# Patient Record
Sex: Female | Born: 1965 | Race: White | Hispanic: No | Marital: Married | Smoking: Current every day smoker
Health system: Southern US, Community
[De-identification: ages and names within clinical notes are randomized; demographics above are authoritative.]

## PROBLEM LIST (undated history)

## (undated) DIAGNOSIS — G43909 Migraine, unspecified, not intractable, without status migrainosus: Secondary | ICD-10-CM

## (undated) DIAGNOSIS — R011 Cardiac murmur, unspecified: Secondary | ICD-10-CM

## (undated) DIAGNOSIS — E0590 Thyrotoxicosis, unspecified without thyrotoxic crisis or storm: Secondary | ICD-10-CM

## (undated) DIAGNOSIS — G709 Myoneural disorder, unspecified: Secondary | ICD-10-CM

## (undated) DIAGNOSIS — R51 Headache: Secondary | ICD-10-CM

## (undated) DIAGNOSIS — J189 Pneumonia, unspecified organism: Secondary | ICD-10-CM

## (undated) DIAGNOSIS — M1990 Unspecified osteoarthritis, unspecified site: Secondary | ICD-10-CM

## (undated) DIAGNOSIS — K219 Gastro-esophageal reflux disease without esophagitis: Secondary | ICD-10-CM

## (undated) DIAGNOSIS — F32A Depression, unspecified: Secondary | ICD-10-CM

## (undated) DIAGNOSIS — I82409 Acute embolism and thrombosis of unspecified deep veins of unspecified lower extremity: Secondary | ICD-10-CM

## (undated) DIAGNOSIS — I10 Essential (primary) hypertension: Secondary | ICD-10-CM

## (undated) DIAGNOSIS — M545 Low back pain: Secondary | ICD-10-CM

## (undated) DIAGNOSIS — R519 Headache, unspecified: Secondary | ICD-10-CM

## (undated) DIAGNOSIS — F329 Major depressive disorder, single episode, unspecified: Secondary | ICD-10-CM

## (undated) DIAGNOSIS — E7800 Pure hypercholesterolemia, unspecified: Secondary | ICD-10-CM

## (undated) DIAGNOSIS — E119 Type 2 diabetes mellitus without complications: Secondary | ICD-10-CM

## (undated) DIAGNOSIS — G8929 Other chronic pain: Secondary | ICD-10-CM

## (undated) HISTORY — PX: ABDOMINAL HYSTERECTOMY: SHX81

## (undated) HISTORY — PX: CHOLECYSTECTOMY: SHX55

## (undated) HISTORY — PX: INCONTINENCE SURGERY: SHX676

## (undated) HISTORY — PX: CARPAL TUNNEL RELEASE: SHX101

## (undated) HISTORY — PX: COLLAGEN INJECTION: SHX5354

---

## 1988-01-06 HISTORY — PX: TUBAL LIGATION: SHX77

## 2011-10-23 ENCOUNTER — Other Ambulatory Visit (HOSPITAL_COMMUNITY): Payer: Self-pay | Admitting: Chiropractic Medicine

## 2011-10-23 DIAGNOSIS — M549 Dorsalgia, unspecified: Secondary | ICD-10-CM

## 2011-10-24 ENCOUNTER — Ambulatory Visit (HOSPITAL_COMMUNITY)
Admission: RE | Admit: 2011-10-24 | Discharge: 2011-10-24 | Disposition: A | Discharge status: Home / Self Care | Payer: Self-pay | Source: Ambulatory Visit | Attending: Chiropractic Medicine | Admitting: Chiropractic Medicine

## 2011-10-24 DIAGNOSIS — M47817 Spondylosis without myelopathy or radiculopathy, lumbosacral region: Secondary | ICD-10-CM | POA: Insufficient documentation

## 2011-10-24 DIAGNOSIS — M545 Low back pain, unspecified: Secondary | ICD-10-CM | POA: Insufficient documentation

## 2011-10-24 DIAGNOSIS — M549 Dorsalgia, unspecified: Secondary | ICD-10-CM

## 2011-10-24 DIAGNOSIS — M79609 Pain in unspecified limb: Secondary | ICD-10-CM | POA: Insufficient documentation

## 2011-12-31 ENCOUNTER — Other Ambulatory Visit: Payer: Self-pay | Admitting: Neurosurgery

## 2011-12-31 DIAGNOSIS — M545 Low back pain: Secondary | ICD-10-CM

## 2012-01-07 ENCOUNTER — Ambulatory Visit
Admission: RE | Admit: 2012-01-07 | Discharge: 2012-01-07 | Disposition: A | Discharge status: Home / Self Care | Payer: BC Managed Care – PPO | Source: Ambulatory Visit | Attending: Neurosurgery | Admitting: Neurosurgery

## 2012-01-07 DIAGNOSIS — M545 Low back pain: Secondary | ICD-10-CM

## 2012-05-17 ENCOUNTER — Other Ambulatory Visit: Payer: Self-pay | Admitting: Neurosurgery

## 2012-05-18 ENCOUNTER — Encounter (HOSPITAL_COMMUNITY): Payer: Self-pay | Admitting: Pharmacy Technician

## 2012-05-21 NOTE — Pre-Procedure Instructions (Signed)
[name]  05/21/2012   Your procedure is scheduled on:  Wednesday, May 21st  Report to [facility] at 0945 AM.  Call this number if you have problems the morning of surgery: [phone]   Remember:   Do not eat food or drink liquids after midnight.   Take these medicines the morning of surgery with A SIP OF WATER: Prilosec, Valium if needed, oxycodone if needed   Do not wear jewelry, make-up or nail polish.  Do not wear lotions, powders, or perfumes. You may wear deodorant.  Do not shave 48 hours prior to surgery. Men may shave face and neck.  Do not bring valuables to the hospital.  Contacts, dentures or bridgework may not be worn into surgery.  Leave suitcase in the car. After surgery it may be brought to your room.  For patients admitted to the hospital, checkout time is 11:00 AM the day of  discharge.   Patients discharged the day of surgery will not be allowed to drive  home.    Special Instructions: Shower using CHG 2 nights before surgery and the night before surgery.  If you shower the day of surgery use CHG.  Use special wash - you have one bottle of CHG for all showers.  You should use approximately 1/3 of the bottle for each shower.   Please read over the following fact sheets that you were given: Pain Booklet, Coughing and Deep Breathing, MRSA Information and Surgical Site Infection Prevention

## 2012-05-23 ENCOUNTER — Encounter (HOSPITAL_COMMUNITY)
Admission: RE | Admit: 2012-05-23 | Discharge: 2012-05-23 | Disposition: A | Discharge status: Home / Self Care | Payer: BC Managed Care – PPO | Source: Ambulatory Visit | Attending: Neurosurgery | Admitting: Neurosurgery

## 2012-05-23 ENCOUNTER — Encounter (HOSPITAL_COMMUNITY): Payer: Self-pay

## 2012-05-23 DIAGNOSIS — Z01810 Encounter for preprocedural cardiovascular examination: Secondary | ICD-10-CM | POA: Insufficient documentation

## 2012-05-23 DIAGNOSIS — Z01812 Encounter for preprocedural laboratory examination: Secondary | ICD-10-CM | POA: Insufficient documentation

## 2012-05-23 DIAGNOSIS — Z01818 Encounter for other preprocedural examination: Secondary | ICD-10-CM | POA: Insufficient documentation

## 2012-05-23 HISTORY — DX: Gastro-esophageal reflux disease without esophagitis: K21.9

## 2012-05-23 HISTORY — DX: Myoneural disorder, unspecified: G70.9

## 2012-05-23 HISTORY — DX: Essential (primary) hypertension: I10

## 2012-05-23 LAB — BASIC METABOLIC PANEL
CO2: 24 mEq/L (ref 19–32)
Chloride: 105 mEq/L (ref 96–112)
GFR calc Af Amer: >90 mL/min (ref >90)
Potassium: 4 mEq/L (ref 3.5–5.1)
Sodium: 141 mEq/L (ref 135–145)

## 2012-05-23 LAB — CBC
HCT: 44.4 % (ref 36.0–46.0)
Hemoglobin: 15.6 g/dL — ABNORMAL HIGH (ref 12.0–15.0)
MCH: 33.6 pg (ref 26.0–34.0)
MCHC: 35.1 g/dL (ref 30.0–36.0)
MCV: 95.7 fL (ref 78.0–100.0)
Platelets: 229 10*3/uL (ref 150–400)
RBC: 4.64 MIL/uL (ref 3.87–5.11)
RDW: 13.1 % (ref 11.5–15.5)
WBC: 11.2 10*3/uL — ABNORMAL HIGH (ref 4.0–10.5)

## 2012-05-23 LAB — SURGICAL PCR SCREEN
MRSA, PCR: NEGATIVE
Staphylococcus aureus: NEGATIVE

## 2012-05-24 MED ORDER — CEFAZOLIN SODIUM-DEXTROSE 2-3 GM-% IV SOLR: 2.0000 g | INTRAVENOUS | Status: DC

## 2012-05-25 ENCOUNTER — Ambulatory Visit (HOSPITAL_COMMUNITY): Payer: BC Managed Care – PPO

## 2012-05-25 ENCOUNTER — Inpatient Hospital Stay (HOSPITAL_COMMUNITY)
Admission: RE | Admit: 2012-05-25 | Discharge: 2012-05-26 | DRG: 758 | Disposition: A | Discharge status: Home / Self Care | Payer: BC Managed Care – PPO | Source: Ambulatory Visit | Attending: Neurosurgery | Admitting: Neurosurgery

## 2012-05-25 ENCOUNTER — Encounter (HOSPITAL_COMMUNITY)
Admission: RE | Disposition: A | Discharge status: Home / Self Care | Payer: Self-pay | Source: Ambulatory Visit | Attending: Neurosurgery

## 2012-05-25 ENCOUNTER — Encounter (HOSPITAL_COMMUNITY): Payer: Self-pay | Admitting: Anesthesiology

## 2012-05-25 ENCOUNTER — Encounter (HOSPITAL_COMMUNITY): Payer: Self-pay | Admitting: *Deleted

## 2012-05-25 ENCOUNTER — Ambulatory Visit (HOSPITAL_COMMUNITY): Payer: BC Managed Care – PPO | Admitting: Anesthesiology

## 2012-05-25 DIAGNOSIS — M5126 Other intervertebral disc displacement, lumbar region: Principal | ICD-10-CM | POA: Diagnosis present

## 2012-05-25 DIAGNOSIS — Z8551 Personal history of malignant neoplasm of bladder: Secondary | ICD-10-CM

## 2012-05-25 DIAGNOSIS — F17200 Nicotine dependence, unspecified, uncomplicated: Secondary | ICD-10-CM | POA: Diagnosis present

## 2012-05-25 DIAGNOSIS — Z9089 Acquired absence of other organs: Secondary | ICD-10-CM

## 2012-05-25 DIAGNOSIS — F411 Generalized anxiety disorder: Secondary | ICD-10-CM | POA: Diagnosis present

## 2012-05-25 DIAGNOSIS — G709 Myoneural disorder, unspecified: Secondary | ICD-10-CM | POA: Diagnosis present

## 2012-05-25 DIAGNOSIS — I10 Essential (primary) hypertension: Secondary | ICD-10-CM | POA: Diagnosis present

## 2012-05-25 DIAGNOSIS — Z794 Long term (current) use of insulin: Secondary | ICD-10-CM

## 2012-05-25 DIAGNOSIS — K219 Gastro-esophageal reflux disease without esophagitis: Secondary | ICD-10-CM | POA: Diagnosis present

## 2012-05-25 DIAGNOSIS — E119 Type 2 diabetes mellitus without complications: Secondary | ICD-10-CM | POA: Diagnosis present

## 2012-05-25 HISTORY — DX: Pure hypercholesterolemia, unspecified: E78.00

## 2012-05-25 HISTORY — PX: LUMBAR DISC SURGERY: SHX700

## 2012-05-25 HISTORY — PX: LUMBAR LAMINECTOMY/DECOMPRESSION MICRODISCECTOMY: SHX5026

## 2012-05-25 HISTORY — DX: Type 2 diabetes mellitus without complications: E11.9

## 2012-05-25 HISTORY — DX: Migraine, unspecified, not intractable, without status migrainosus: G43.909

## 2012-05-25 HISTORY — DX: Other chronic pain: G89.29

## 2012-05-25 HISTORY — DX: Headache, unspecified: R51.9

## 2012-05-25 HISTORY — DX: Headache: R51

## 2012-05-25 HISTORY — DX: Low back pain: M54.5

## 2012-05-25 LAB — GLUCOSE, CAPILLARY
Glucose-Capillary: 106 mg/dL — ABNORMAL HIGH (ref 70–99)
Glucose-Capillary: 83 mg/dL (ref 70–99)
Glucose-Capillary: 132 mg/dL — ABNORMAL HIGH (ref 70–99)

## 2012-05-25 SURGERY — LUMBAR LAMINECTOMY/DECOMPRESSION MICRODISCECTOMY 1 LEVEL
Anesthesia: General | Site: Back | Laterality: Right | Wound class: Clean

## 2012-05-25 MED ORDER — SIMVASTATIN 10 MG PO TABS
10.0000 mg | ORAL_TABLET | Freq: Every day | ORAL | Status: DC
  Administered 2012-05-25: 10 mg via ORAL
  Filled 2012-05-25 (×2): qty 1

## 2012-05-25 MED ORDER — DIAZEPAM 5 MG PO TABS
5.0000 mg | ORAL_TABLET | Freq: Four times a day (QID) | ORAL | Status: DC | PRN
  Administered 2012-05-26: 5 mg via ORAL
  Filled 2012-05-25: qty 1

## 2012-05-25 MED ORDER — PROPOFOL 10 MG/ML IV BOLUS
INTRAVENOUS | Status: DC | PRN
  Administered 2012-05-25: 200 mg via INTRAVENOUS

## 2012-05-25 MED ORDER — CEFAZOLIN SODIUM 1-5 GM-% IV SOLN
1.0000 g | Freq: Three times a day (TID) | INTRAVENOUS | Status: AC
  Administered 2012-05-25 – 2012-05-26 (×2): 1 g via INTRAVENOUS
  Filled 2012-05-25 (×2): qty 50

## 2012-05-25 MED ORDER — OXYCODONE-ACETAMINOPHEN 5-325 MG PO TABS
ORAL_TABLET | ORAL | Status: AC
  Filled 2012-05-25: qty 2

## 2012-05-25 MED ORDER — LIDOCAINE HCL (CARDIAC) 20 MG/ML IV SOLN
INTRAVENOUS | Status: DC | PRN
  Administered 2012-05-25: 100 mg via INTRAVENOUS

## 2012-05-25 MED ORDER — OXYCODONE-ACETAMINOPHEN 5-325 MG PO TABS
1.0000 | ORAL_TABLET | ORAL | Status: DC | PRN
  Administered 2012-05-25 – 2012-05-26 (×3): 2 via ORAL
  Filled 2012-05-25 (×2): qty 2

## 2012-05-25 MED ORDER — OXYCODONE HCL 5 MG PO TABS
ORAL_TABLET | ORAL | Status: AC
  Filled 2012-05-25: qty 1

## 2012-05-25 MED ORDER — 0.9 % SODIUM CHLORIDE (POUR BTL) OPTIME
TOPICAL | Status: DC | PRN
  Administered 2012-05-25: 1000 mL

## 2012-05-25 MED ORDER — ZOLPIDEM TARTRATE 5 MG PO TABS: 5.0000 mg | ORAL_TABLET | Freq: Every evening | ORAL | Status: DC | PRN

## 2012-05-25 MED ORDER — LIDOCAINE HCL 4 % MT SOLN
OROMUCOSAL | Status: DC | PRN
  Administered 2012-05-25: 4 mL via TOPICAL

## 2012-05-25 MED ORDER — HYDROMORPHONE HCL PF 1 MG/ML IJ SOLN
0.2500 mg | INTRAMUSCULAR | Status: DC | PRN
  Administered 2012-05-25 (×2): 0.5 mg via INTRAVENOUS

## 2012-05-25 MED ORDER — BUPIVACAINE LIPOSOME 1.3 % IJ SUSP
INTRAMUSCULAR | Status: DC | PRN
  Administered 2012-05-25: 20 mL

## 2012-05-25 MED ORDER — LISINOPRIL 10 MG PO TABS
10.0000 mg | ORAL_TABLET | Freq: Every morning | ORAL | Status: DC
  Filled 2012-05-25: qty 1

## 2012-05-25 MED ORDER — ONDANSETRON HCL 4 MG/2ML IJ SOLN
INTRAMUSCULAR | Status: DC | PRN
  Administered 2012-05-25: 4 mg via INTRAVENOUS

## 2012-05-25 MED ORDER — ACETAMINOPHEN 650 MG RE SUPP: 650.0000 mg | RECTAL | Status: DC | PRN

## 2012-05-25 MED ORDER — HEMOSTATIC AGENTS (NO CHARGE) OPTIME
TOPICAL | Status: DC | PRN
  Administered 2012-05-25: 1 via TOPICAL

## 2012-05-25 MED ORDER — SODIUM CHLORIDE 0.9 % IV SOLN
250.0000 mL | INTRAVENOUS | Status: DC
  Filled 2012-05-25: qty 250

## 2012-05-25 MED ORDER — GLYCOPYRROLATE 0.2 MG/ML IJ SOLN
INTRAMUSCULAR | Status: DC | PRN
  Administered 2012-05-25: .6 mg via INTRAVENOUS

## 2012-05-25 MED ORDER — ONDANSETRON HCL 4 MG/2ML IJ SOLN: 4.0000 mg | Freq: Four times a day (QID) | INTRAMUSCULAR | Status: DC | PRN

## 2012-05-25 MED ORDER — NEOSTIGMINE METHYLSULFATE 1 MG/ML IJ SOLN
INTRAMUSCULAR | Status: DC | PRN
  Administered 2012-05-25: 5 mg via INTRAVENOUS

## 2012-05-25 MED ORDER — SODIUM CHLORIDE 0.9 % IJ SOLN: 3.0000 mL | INTRAMUSCULAR | Status: DC | PRN

## 2012-05-25 MED ORDER — PHENOL 1.4 % MT LIQD: 1.0000 | OROMUCOSAL | Status: DC | PRN

## 2012-05-25 MED ORDER — HYDROMORPHONE HCL PF 1 MG/ML IJ SOLN
INTRAMUSCULAR | Status: AC
  Filled 2012-05-25: qty 1

## 2012-05-25 MED ORDER — FENTANYL CITRATE 0.05 MG/ML IJ SOLN
INTRAMUSCULAR | Status: AC
  Filled 2012-05-25: qty 2

## 2012-05-25 MED ORDER — METFORMIN HCL 500 MG PO TABS
1000.0000 mg | ORAL_TABLET | Freq: Every evening | ORAL | Status: DC
  Filled 2012-05-25: qty 2

## 2012-05-25 MED ORDER — MAGNESIUM HYDROXIDE 400 MG/5ML PO SUSP: 30.0000 mL | Freq: Every day | ORAL | Status: DC | PRN

## 2012-05-25 MED ORDER — OXYCODONE HCL 5 MG/5ML PO SOLN: 5.0000 mg | Freq: Once | ORAL | Status: AC | PRN

## 2012-05-25 MED ORDER — CEFAZOLIN SODIUM-DEXTROSE 2-3 GM-% IV SOLR
INTRAVENOUS | Status: AC
  Administered 2012-05-25: 2 g via INTRAVENOUS
  Filled 2012-05-25: qty 50

## 2012-05-25 MED ORDER — THROMBIN 5000 UNITS EX SOLR
CUTANEOUS | Status: DC | PRN
  Administered 2012-05-25 (×2): 5000 [IU] via TOPICAL

## 2012-05-25 MED ORDER — MORPHINE SULFATE 2 MG/ML IJ SOLN
1.0000 mg | INTRAMUSCULAR | Status: DC | PRN
  Administered 2012-05-25 – 2012-05-26 (×3): 2 mg via INTRAVENOUS
  Filled 2012-05-25 (×3): qty 1

## 2012-05-25 MED ORDER — MENTHOL 3 MG MT LOZG: 1.0000 | LOZENGE | OROMUCOSAL | Status: DC | PRN

## 2012-05-25 MED ORDER — ONDANSETRON HCL 4 MG/2ML IJ SOLN: 4.0000 mg | INTRAMUSCULAR | Status: DC | PRN

## 2012-05-25 MED ORDER — BUPIVACAINE LIPOSOME 1.3 % IJ SUSP
20.0000 mL | INTRAMUSCULAR | Status: DC
  Filled 2012-05-25: qty 20

## 2012-05-25 MED ORDER — FENTANYL CITRATE 0.05 MG/ML IJ SOLN
INTRAMUSCULAR | Status: DC | PRN
  Administered 2012-05-25: 100 ug via INTRAVENOUS

## 2012-05-25 MED ORDER — OXYCODONE HCL 5 MG PO TABS
5.0000 mg | ORAL_TABLET | Freq: Once | ORAL | Status: AC | PRN
  Administered 2012-05-25: 5 mg via ORAL

## 2012-05-25 MED ORDER — MIDAZOLAM HCL 5 MG/5ML IJ SOLN
INTRAMUSCULAR | Status: DC | PRN
  Administered 2012-05-25: 2 mg via INTRAVENOUS

## 2012-05-25 MED ORDER — METHYLPREDNISOLONE ACETATE 80 MG/ML IJ SUSP
INTRAMUSCULAR | Status: DC | PRN
  Administered 2012-05-25: 80 mg via INTRA_ARTICULAR

## 2012-05-25 MED ORDER — LACTATED RINGERS IV SOLN
INTRAVENOUS | Status: DC | PRN
  Administered 2012-05-25 (×2): via INTRAVENOUS

## 2012-05-25 MED ORDER — SODIUM CHLORIDE 0.9 % IJ SOLN
3.0000 mL | Freq: Two times a day (BID) | INTRAMUSCULAR | Status: DC
  Administered 2012-05-26: 3 mL via INTRAVENOUS

## 2012-05-25 MED ORDER — SODIUM CHLORIDE 0.9 % IV SOLN
INTRAVENOUS | Status: DC
  Administered 2012-05-25: 21:00:00 via INTRAVENOUS

## 2012-05-25 MED ORDER — SUFENTANIL CITRATE 50 MCG/ML IV SOLN
INTRAVENOUS | Status: DC | PRN
  Administered 2012-05-25: 5 ug via INTRAVENOUS
  Administered 2012-05-25: 10 ug via INTRAVENOUS
  Administered 2012-05-25: 20 ug via INTRAVENOUS
  Administered 2012-05-25: 5 ug via INTRAVENOUS
  Administered 2012-05-25: 10 ug via INTRAVENOUS

## 2012-05-25 MED ORDER — ACETAMINOPHEN 325 MG PO TABS: 650.0000 mg | ORAL_TABLET | ORAL | Status: DC | PRN

## 2012-05-25 MED ORDER — ROCURONIUM BROMIDE 100 MG/10ML IV SOLN
INTRAVENOUS | Status: DC | PRN
  Administered 2012-05-25: 10 mg via INTRAVENOUS
  Administered 2012-05-25: 50 mg via INTRAVENOUS
  Administered 2012-05-25: 10 mg via INTRAVENOUS

## 2012-05-25 SURGICAL SUPPLY — 62 items
APL SKNCLS STERI-STRIP NONHPOA (GAUZE/BANDAGES/DRESSINGS) ×1
BENZOIN TINCTURE PRP APPL 2/3 (GAUZE/BANDAGES/DRESSINGS) ×2 IMPLANT
BLADE SURG ROTATE 9660 (MISCELLANEOUS) IMPLANT
BUR ACORN 6.0 (BURR) ×2 IMPLANT
BUR MATCHSTICK NEURO 3.0 LAGG (BURR) ×1 IMPLANT
CANISTER SUCTION 2500CC (MISCELLANEOUS) ×2 IMPLANT
CLOTH BEACON ORANGE TIMEOUT ST (SAFETY) ×2 IMPLANT
CONT SPEC 4OZ CLIKSEAL STRL BL (MISCELLANEOUS) ×2 IMPLANT
DRAPE C-ARM 42X72 X-RAY (DRAPES) ×2 IMPLANT
DRAPE LAPAROTOMY 100X72X124 (DRAPES) ×2 IMPLANT
DRAPE MICROSCOPE LEICA (MISCELLANEOUS) ×2 IMPLANT
DRAPE POUCH INSTRU U-SHP 10X18 (DRAPES) ×2 IMPLANT
DRSG PAD ABDOMINAL 8X10 ST (GAUZE/BANDAGES/DRESSINGS) ×2 IMPLANT
DURAPREP 26ML APPLICATOR (WOUND CARE) ×2 IMPLANT
ELECT BLADE 4.0 EZ CLEAN MEGAD (MISCELLANEOUS) ×2
ELECT REM PT RETURN 9FT ADLT (ELECTROSURGICAL) ×2
ELECTRODE BLDE 4.0 EZ CLN MEGD (MISCELLANEOUS) IMPLANT
ELECTRODE REM PT RTRN 9FT ADLT (ELECTROSURGICAL) ×1 IMPLANT
GAUZE SPONGE 4X4 16PLY XRAY LF (GAUZE/BANDAGES/DRESSINGS) IMPLANT
GLOVE BIO SURGEON STRL SZ8.5 (GLOVE) ×1 IMPLANT
GLOVE BIOGEL M 8.0 STRL (GLOVE) ×3 IMPLANT
GLOVE BIOGEL PI IND STRL 8 (GLOVE) IMPLANT
GLOVE BIOGEL PI INDICATOR 8 (GLOVE) ×2
GLOVE ECLIPSE 7.5 STRL STRAW (GLOVE) ×2 IMPLANT
GLOVE EXAM NITRILE LRG STRL (GLOVE) IMPLANT
GLOVE EXAM NITRILE MD LF STRL (GLOVE) IMPLANT
GLOVE EXAM NITRILE XL STR (GLOVE) IMPLANT
GLOVE EXAM NITRILE XS STR PU (GLOVE) IMPLANT
GOWN BRE IMP SLV AUR LG STRL (GOWN DISPOSABLE) ×2 IMPLANT
GOWN BRE IMP SLV AUR XL STRL (GOWN DISPOSABLE) IMPLANT
GOWN STRL REIN 2XL LVL4 (GOWN DISPOSABLE) IMPLANT
KIT BASIN OR (CUSTOM PROCEDURE TRAY) ×2 IMPLANT
KIT ROOM TURNOVER OR (KITS) ×2 IMPLANT
NDL HYPO 18GX1.5 BLUNT FILL (NEEDLE) IMPLANT
NDL HYPO 21X1.5 SAFETY (NEEDLE) IMPLANT
NDL HYPO 25X1 1.5 SAFETY (NEEDLE) IMPLANT
NDL SPNL 20GX3.5 QUINCKE YW (NEEDLE) IMPLANT
NEEDLE HYPO 18GX1.5 BLUNT FILL (NEEDLE) ×4 IMPLANT
NEEDLE HYPO 21X1.5 SAFETY (NEEDLE) ×2 IMPLANT
NEEDLE HYPO 25X1 1.5 SAFETY (NEEDLE) ×2 IMPLANT
NEEDLE SPNL 20GX3.5 QUINCKE YW (NEEDLE) IMPLANT
NS IRRIG 1000ML POUR BTL (IV SOLUTION) ×2 IMPLANT
PACK LAMINECTOMY NEURO (CUSTOM PROCEDURE TRAY) ×2 IMPLANT
PAD ARMBOARD 7.5X6 YLW CONV (MISCELLANEOUS) ×6 IMPLANT
PATTIES SURGICAL .5 X1 (DISPOSABLE) ×1 IMPLANT
RUBBERBAND STERILE (MISCELLANEOUS) ×4 IMPLANT
SENSORCAINE WITH EPI 1:200,000 (MPF) IMPLANT
SPONGE GAUZE 4X4 12PLY (GAUZE/BANDAGES/DRESSINGS) ×2 IMPLANT
SPONGE LAP 4X18 X RAY DECT (DISPOSABLE) IMPLANT
SPONGE SURGIFOAM ABS GEL SZ50 (HEMOSTASIS) ×2 IMPLANT
STRIP CLOSURE SKIN 1/2X4 (GAUZE/BANDAGES/DRESSINGS) ×2 IMPLANT
SUT VIC AB 0 CT1 18XCR BRD8 (SUTURE) ×1 IMPLANT
SUT VIC AB 0 CT1 8-18 (SUTURE) ×2
SUT VIC AB 2-0 CP2 18 (SUTURE) ×2 IMPLANT
SUT VIC AB 3-0 SH 8-18 (SUTURE) ×2 IMPLANT
SYR 20CC LL (SYRINGE) ×1 IMPLANT
SYR 20ML ECCENTRIC (SYRINGE) ×2 IMPLANT
SYR 5ML LL (SYRINGE) ×1 IMPLANT
TAPE CLOTH SURG 4X10 WHT LF (GAUZE/BANDAGES/DRESSINGS) ×1 IMPLANT
TOWEL OR 17X24 6PK STRL BLUE (TOWEL DISPOSABLE) ×2 IMPLANT
TOWEL OR 17X26 10 PK STRL BLUE (TOWEL DISPOSABLE) ×2 IMPLANT
WATER STERILE IRR 1000ML POUR (IV SOLUTION) ×2 IMPLANT

## 2012-05-25 NOTE — Transfer of Care (Signed)
Immediate Anesthesia Transfer of Care Note  Patient: Ashley Reilly  Procedure(s) Performed: Procedure(s) with comments: Right Lumbar four-five Microdiskectomy  (Right) - Right Lumbar four-five Microdiskectomy  Patient Location: PACU  Anesthesia Type:General  Level of Consciousness: awake, sedated and patient cooperative  Airway & Oxygen Therapy: Patient Spontanous Breathing and Patient connected to nasal cannula oxygen  Post-op Assessment: Report given to PACU RN, Post -op Vital signs reviewed and stable, Patient moving all extremities and Patient moving all extremities X 4  Post vital signs: Reviewed and stable  Complications: No apparent anesthesia complications

## 2012-05-25 NOTE — Progress Notes (Signed)
Patient went off unit with IV pole to smoke outside with female companion. Patient states " I waited 45 minutes for someone to call the doctor to get me permission to go off the unit so I can smoke. I want to walk around. I cannot sit in one spot all day." Patient underwent a right lumbar 4-5 microdiskectomy today. Dr.Cabbell made aware of incident and stated that patient is not permitted to go off unit to smoke.

## 2012-05-25 NOTE — Progress Notes (Signed)
While giving pain medication, patient states" Now I'm going to be honest with you. I had to go and pee and I got out of the chair and bent down to unplug the IV and I fell over and landed on my right side." Patient states" No one saw me fall. It was right after the admitting nurse and the nurse tech left the room.

## 2012-05-25 NOTE — Progress Notes (Signed)
Op note [phone]

## 2012-05-25 NOTE — Progress Notes (Signed)
Received patient from PACU; vitals stable.  Patient is alert and oriented x4 and denies pain.  Dressing clean/dry/intact to lower back.  Patient oriented to room and unit.  Will continue to monitor.

## 2012-05-25 NOTE — Anesthesia Preprocedure Evaluation (Signed)
Anesthesia Evaluation  Patient identified by MRN, date of birth, ID band Patient awake    Reviewed: Allergy & Precautions, H&P , NPO status , Patient's Chart, lab work & pertinent test results  Airway Mallampati: II  Neck ROM: full    Dental   Pulmonary Current Smoker,          Cardiovascular hypertension,     Neuro/Psych  Headaches,  Neuromuscular disease    GI/Hepatic GERD-  ,  Endo/Other  diabetes, Type 2obese  Renal/GU      Musculoskeletal   Abdominal   Peds  Hematology   Anesthesia Other Findings   Reproductive/Obstetrics                           Anesthesia Physical Anesthesia Plan  ASA: III  Anesthesia Plan: General   Post-op Pain Management:    Induction: Intravenous  Airway Management Planned: Oral ETT  Additional Equipment:   Intra-op Plan:   Post-operative Plan: Extubation in OR  Informed Consent: I have reviewed the patients History and Physical, chart, labs and discussed the procedure including the risks, benefits and alternatives for the proposed anesthesia with the patient or authorized representative who has indicated his/her understanding and acceptance.     Plan Discussed with: CRNA, Anesthesiologist and Surgeon  Anesthesia Plan Comments:         Anesthesia Quick Evaluation

## 2012-05-25 NOTE — Progress Notes (Signed)
Patient made aware that she is not allowed to go off the unit to smoke as per Dr. Franky Macho but can walk around the unit as much as she likes. Patient states "Yeah.well"

## 2012-05-25 NOTE — H&P (Signed)
Ashley Reilly is an 47 y.o. female.   Chief Complaint: lbp HPI: while at work back in 10/13 she developed lbp with radiation to the right leg. Since then she has been seen by the chiropractic, pain clinic, had epidurals with no relief of the pain.  Past Medical History  Diagnosis Date  . Hypertension   . Headache     migraines  . Neuromuscular disorder     rt leg tingling/numbness  . GERD (gastroesophageal reflux disease)   . Diabetes mellitus without complication     Past Surgical History  Procedure Laterality Date  . Tubal ligation    . Bladder surgery      tumors removed+ sling placed  . Cholecystectomy    . Carpal tunnel release      left    History reviewed. No pertinent family history. Social History:  reports that she has been smoking.  She does not have any smokeless tobacco history on file. She reports that she does not drink alcohol or use illicit drugs.  Allergies: No Known Allergies  Medications Prior to Admission  Medication Sig Dispense Refill  . diazepam (VALIUM) 5 MG tablet Take 5 mg by mouth every 6 (six) hours as needed for anxiety. For spasms      . lisinopril (PRINIVIL,ZESTRIL) 10 MG tablet Take 10 mg by mouth every morning.      . metFORMIN (GLUCOPHAGE) 500 MG tablet Take 1,000 mg by mouth every evening. Take with food      . omeprazole (PRILOSEC) 20 MG capsule Take 20 mg by mouth 2 (two) times daily.      Marland Kitchen oxyCODONE (ROXICODONE) 15 MG immediate release tablet Take 15 mg by mouth every 4 (four) hours as needed for pain. For pain      . pravastatin (PRAVACHOL) 10 MG tablet Take 10 mg by mouth at bedtime.        Results for orders placed during the hospital encounter of 05/25/12 (from the past 48 hour(s))  GLUCOSE, CAPILLARY     Status: Abnormal   Collection Time    05/25/12 10:26 AM      Result Value Range   Glucose-Capillary 106 (*) 70 - 99 mg/dL   Dg Chest 2 View  1/61/0960   *RADIOLOGY REPORT*  Clinical Data: Hypertension  CHEST - 2 VIEW   Comparison: None.  Findings: Cardiomediastinal silhouette appears normal.  No acute pulmonary disease is noted.  Bony thorax is intact.  IMPRESSION: No acute cardiopulmonary abnormality seen.   Original Report Authenticated By: Lupita Raider.,  M.D.    Review of Systems  Constitutional: Negative.   HENT: Negative.   Eyes: Negative.   Respiratory: Negative.   Cardiovascular: Negative.        Arterial hypertension  Gastrointestinal: Negative.   Genitourinary: Negative.   Musculoskeletal: Positive for back pain.  Skin: Negative.   Neurological: Positive for sensory change and focal weakness.  Endo/Heme/Allergies:       DM  Psychiatric/Behavioral: Negative.     Blood pressure 107/68, pulse 99, temperature 97.6 F (36.4 C), temperature source Oral, resp. rate 20, last menstrual period 05/19/2012, SpO2 96.00%. Physical Exam  Patient limping from the right leg. Hent,nl.neck, nl. Cv, nl. Lungs, clear. Abdomen, soft. Extremities, nl. NEURO WEAKNESS OF df OF RIGHT FOOT, 3/5. DECREASE OF SENSATION DORSUM OF RIGHT FOOT.  Mri shows a right extraforaminal hnp Assessment/Plan In view of no improvement, she is going to have a extraforaminal discectomy. She and her husband are aware of  risks and benefits  Ashley Reilly M 05/25/2012, 11:07 AM

## 2012-05-25 NOTE — Anesthesia Procedure Notes (Signed)
Procedure Name: Intubation Date/Time: 05/25/2012 1:51 PM Performed by: Coralee Rud Pre-anesthesia Checklist: Patient identified, Emergency Drugs available, Suction available and Patient being monitored Patient Re-evaluated:Patient Re-evaluated prior to inductionOxygen Delivery Method: Circle system utilized Preoxygenation: Pre-oxygenation with 100% oxygen Intubation Type: IV induction Ventilation: Mask ventilation without difficulty Laryngoscope Size: Miller and 3 Grade View: Grade I Tube type: Oral Tube size: 7.5 mm Airway Equipment and Method: Stylet and LTA kit utilized Placement Confirmation: ETT inserted through vocal cords under direct vision,  positive ETCO2 and breath sounds checked- equal and bilateral Secured at: 22 cm Tube secured with: Tape Dental Injury: Teeth and Oropharynx as per pre-operative assessment

## 2012-05-26 ENCOUNTER — Encounter (HOSPITAL_COMMUNITY): Payer: Self-pay | Admitting: Neurosurgery

## 2012-05-26 LAB — GLUCOSE, CAPILLARY: Glucose-Capillary: 148 mg/dL — ABNORMAL HIGH (ref 70–99)

## 2012-05-26 NOTE — Op Note (Signed)
NAMESHENITA, TREGO                 ACCOUNT NO.:  [id number]  MEDICAL RECORD NO.:  [id number]  LOCATION:  [id number]                        FACILITY:  [facility]  PHYSICIAN:  Hilda Lias, M.D.   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  PROCEDURE:  Right L4-5 extraforaminal diskectomy, decompression of the L4 nerve root.  Lysis of adhesion.  Microscope.  C-arm.  SURGEON:  Hilda Lias, M.D.  ASSISTANT:  Dr. Franky Macho.  CLINICAL HISTORY:  Ms. Funderburke is a lady who had been complaining of back pain worsened to right leg since she had bent over toward back in October last year.  She has failed with conservative treatment including medication.  Epidural injection, chiropractor manipulation.  MRI which was repeated showed that he has an extraforaminal disk at L4-L5 affecting the L4 nerve root.  The patient felt that the pain was getting worse.  She has a weakness of dorsiflexion in the right foot.  Surgery was advised and the risks were explained to her and her husband.  DESCRIPTION OF PROCEDURE:  The patient was taken to the OR, and she has been intubation.  She was positioned in a prone manner.  The back was cleaned with DuraPrep.  Using the C-arm in AP view, we selected area of about an inch and a half away from the midline between the transverse process of L4 and L5.  Then, drapes were applied.  Incision was made in that area through the skin, subcutaneous tissue, through the muscle layers until we were right at the level of 4-5 transverse process.  X- rays showed that we were right at the right area.  Then, we brought the microscope into the area.  We removed the intertransverse ligament and we split the  muscle.  We found the L4 nerve root.  What we found 1st was quite a bit of adhesion and lysis was accomplished.  Then, we were able to mobilize the L4 nerve root and the takeoff there was at disk with some calcification compromising the L4 nerve root.  The incision was made and combination of hard and soft this were removed.  Repeat x-ray showed that we were at the right area.  Valsalva maneuver was negative. At the end, we had plenty of space for the L4 nerve root.  Then, the nerve was mobile.  Then, fentanyl and Depo-Medrol were left in the area and the wound was closed with Vicryl and Steri-Strips.         ______________________________ Hilda Lias, M.D.    EB/MEDQ  D:  05/25/2012  T:  05/26/2012  Job:  [id number]

## 2012-05-26 NOTE — Progress Notes (Signed)
Pt made aware that she is now a high fall risk and fall safety prevention plan was reviewed with pt and husband.  When asked about the incident, she said she slipped out of the chair while attempting to unplug her IV pole and assisted herself to the ground from the chair.  She said that she didn't call anyone because she was "doing so well walking alone" (prior to the event).  She is now c/o weakness to RLE and soreness to her R hip.  No obvious signs of trauma to the area, will continue to monitor.

## 2012-05-26 NOTE — Progress Notes (Signed)
At about 1945,pt observed to walk out of the unit with iv fluid infusing to the main entrance,followed pt down to the lobby and tried to encourage pt to come back upstairs that we have to obtain a doctor's order to allow her off the unit,but she refused saying that she needed some fresh air,so she walk outside accompanied with her husband, sat on the chair and started smoking. So i came back up to determine the room she was admitted which eventually was room 4n18, pt's RNElnita Maxwell) was informed,pt came back to the floor at about 2010. Obasogie-Asidi, Kamryn Messineo Efe

## 2012-05-26 NOTE — Progress Notes (Signed)
Pt ambulated around the unit this am with RN with gait belt and front wheel walker.  Pt no longer c/o weakness in the R leg, gait was steady, slightly slow.  Pt tolerated ambulation well and was put back in the chair for breakfast.  Pt pleasant and has no other complaints at this time, report given to day shift RN.  Fall prevention reinforced one more time, both pt and husband stated they understand.

## 2012-05-26 NOTE — Care Management Note (Signed)
    Page 1 of 1   05/26/2012     1:36:38 PM   CARE MANAGEMENT NOTE 05/26/2012  Patient:  Ashley Reilly, Ashley Reilly   Account Number:  [id number]  Date Initiated:  05/26/2012  Documentation initiated by:  [facility]  Subjective/Objective Assessment:   admitted postop rt Ldiscectomy     Action/Plan:   PT eval-no follow up recommended  recommended rolling walker   Anticipated DC Date:  05/26/2012   Anticipated DC Plan:  HOME/SELF CARE      DC Planning Services  CM consult      Choice offered to / List presented to:     DME arranged  [facility]      DME agency  [facility]        Status of service:  Completed, signed off Medicare Important Message given?   (If response is "NO", the following Medicare IM given date fields will be blank) Date Medicare IM given:   Date Additional Medicare IM given:    Discharge Disposition:  HOME/SELF CARE  Per UR Regulation:  Reviewed for med. necessity/level of care/duration of stay  If discussed at Long Length of Stay Meetings, dates discussed:    Comments:

## 2012-05-26 NOTE — Evaluation (Signed)
Physical Therapy Evaluation Patient Details Name: Ashley Reilly MRN: [id number] DOB: [birth year] Today's Date: 05/26/2012 Time: 4098-1191 PT Time Calculation (min): 32 min  PT Assessment / Plan / Recommendation Clinical Impression  pt s/p diskectomy at L 4/5.  Pt mobiliing as expected.  All education completed and questions answered.  No further PT needs.  Recommend Youth RW.    PT Assessment  Patent does not need any further PT services    Follow Up Recommendations  No PT follow up    Does the patient have the potential to tolerate intense rehabilitation      Barriers to Discharge        Equipment Recommendations  Rolling walker with 5" wheels;Other (comment) (YOUTH RW)    Recommendations for Other Services     Frequency      Precautions / Restrictions Precautions Precautions: Back   Pertinent Vitals/Pain       Mobility  Bed Mobility Bed Mobility: Supine to Sit;Sitting - Scoot to Edge of Bed;Sit to Supine Supine to Sit: 7: Independent;HOB flat Sitting - Scoot to Edge of Bed: 7: Independent Sit to Supine: 7: Independent;HOB flat Details for Bed Mobility Assistance: Pt with bed elevated > 36 inches to simulate surface at home. pt able to complete and maintain precautions Transfers Transfers: Sit to Stand;Stand to Sit Sit to Stand: 6: Modified independent (Device/Increase time);From elevated surface;From chair/3-in-1 Stand to Sit: 6: Modified independent (Device/Increase time);With upper extremity assist;To chair/3-in-1 Details for Transfer Assistance: good hand placement and husband educated on positioning to guard Ambulation/Gait Ambulation/Gait Assistance: 6: Modified independent (Device/Increase time) Ambulation Distance (Feet): 900 Feet Assistive device: Rolling walker Ambulation/Gait Assistance Details: steady gait, reinforced no twisting to look around everywhere Gait Pattern: Within Functional Limits Gait velocity: slower Stairs: Yes Stairs Assistance: 5:  Supervision Stair Management Technique: One rail Right;Step to pattern;Forwards Number of Stairs: 4 Wheelchair Mobility Wheelchair Mobility: No    Exercises Other Exercises Other Exercises: Pt educated on healing process 8 - 12 week period of time. Importance of maintaining precautions for proper healing. Pt educated on the need to decr smoking and caffeine to promote bone growth.  Other Exercises: pt agreeable to decr amount of smoking however pt did not agree to quit.   PT Diagnosis:    PT Problem List:   PT Treatment Interventions:     PT Goals    Visit Information  Last PT Received On: 05/26/12 Assistance Needed: +1    Subjective Data  Subjective: I'm 47 and I'm going to do what I'm going to dol Patient Stated Goal: Independent   Prior Functioning  Home Living Lives With: Spouse Available Help at Discharge: Family Type of Home: House Home Access: Stairs to enter Secretary/administrator of Steps: 5 Entrance Stairs-Rails: Right Home Layout: One level Bathroom Shower/Tub: Walk-in shower;Door Dentist: None Prior Function Level of Independence: Independent Able to Take Stairs?: Yes Driving: No Vocation:  (works as Lawyer) Musician: No difficulties Dominant Hand: Left    Cognition  Cognition Arousal/Alertness: Awake/alert Behavior During Therapy: WFL for tasks assessed/performed Overall Cognitive Status: Within Functional Limits for tasks assessed    Extremity/Trunk Assessment Right Upper Extremity Assessment RUE ROM/Strength/Tone: Within functional levels;Due to precautions RUE Sensation: WFL - Light Touch RUE Coordination: WFL - gross/fine motor Left Upper Extremity Assessment LUE ROM/Strength/Tone: WFL for tasks assessed;Due to precautions LUE Sensation: WFL - Light Touch LUE Coordination: WFL - gross/fine motor Right Lower Extremity Assessment RLE ROM/Strength/Tone: Within functional levels Left  Lower Extremity Assessment LLE ROM/Strength/Tone: Within functional levels Trunk Assessment Trunk Assessment: Normal   Balance    End of Session PT - End of Session Equipment Utilized During Treatment: Back brace Activity Tolerance: Patient tolerated treatment well Patient left: in chair;with call bell/phone within reach;with family/visitor present Nurse Communication: Mobility status  GP     Alohilani Levenhagen, [name] 05/26/2012, 12:20 PM 05/26/2012  Mitchell Bing, PT [phone]  (pager)

## 2012-05-26 NOTE — Progress Notes (Signed)
Utilization Review Completed.   Juanisha Bautch, RN, BSN Nurse Case Manager  [phone]  

## 2012-05-26 NOTE — Progress Notes (Signed)
Despite pervious discussion regarding the importance of calling for assistance to the bathroom, pt husband was found attempting to lift pt out of bed to use the [facility].  Reinforced education about fall prevention, both pt and husband stated they understood.

## 2012-05-26 NOTE — Discharge Summary (Signed)
Physician Discharge Summary  Patient ID: Ashley Reilly MRN: [id number] DOB/AGE: [birth year] 47 y.o.  Admit date: 05/25/2012 Discharge date: 05/26/2012  Admission Diagnoses:right HNP  Discharge Diagnoses: SAME Active Problems:   * No active hospital problems. *   Discharged Condition:NO PAIN  Hospital Course: surgery  Consults: none  Significant Diagnostic Studies: mri  Treatments:right extraforaminal discectomy  Discharge Exam: Ambulating, no pain  Disposition: home     Medication List    ASK your doctor about these medications       diazepam 5 MG tablet  Commonly known as:  VALIUM  Take 5 mg by mouth every 6 (six) hours as needed for anxiety. For spasms     lisinopril 10 MG tablet  Commonly known as:  PRINIVIL,ZESTRIL  Take 10 mg by mouth every morning.     metFORMIN 500 MG tablet  Commonly known as:  GLUCOPHAGE  Take 1,000 mg by mouth every evening. Take with food     omeprazole 20 MG capsule  Commonly known as:  PRILOSEC  Take 20 mg by mouth 2 (two) times daily.     oxyCODONE 15 MG immediate release tablet  Commonly known as:  ROXICODONE  Take 15 mg by mouth every 4 (four) hours as needed for pain. For pain     pravastatin 10 MG tablet  Commonly known as:  PRAVACHOL  Take 10 mg by mouth at bedtime.         Signed: Karn Cassis 05/26/2012, 9:05 AM

## 2012-05-26 NOTE — Progress Notes (Signed)
AVS and discharge instruction given to pt pt verbalized good understanding by stating 3 back precautions and how to take care of her incision . goog hand hygine reinforced and reminded to call Md for follow up appointment in two weeks. . Pain is relieved rated pain level at 3/10 at present . Condition is stable awaiting CM to deliver DME  youth walker. Azzie Roup RN

## 2012-05-26 NOTE — Progress Notes (Signed)
Dr. Franky Macho made aware of incident

## 2012-05-26 NOTE — Evaluation (Signed)
Occupational Therapy Evaluation Patient Details Name: Ashley Reilly MRN: [id number] DOB: November 27, 1965 Today's Date: 05/26/2012 Time: 0922-1006 OT Time Calculation (min): 44 min  OT Assessment / Plan / Recommendation Clinical Impression    47 yo female s/p diskectomy L4-5 that does not require skilled OT acutely. Recommend Rw (youth ) for d/c.     OT Assessment  Patient does not need any further OT services    Follow Up Recommendations  No OT follow up    Barriers to Discharge      Equipment Recommendations  Other (comment) (RW youth)    Recommendations for Other Services    Frequency       Precautions / Restrictions Precautions Precautions: Back   Pertinent Vitals/Pain Taking pain medication on arrival. Reports pain but states I can do this    ADL  Eating/Feeding: Independent Where Assessed - Eating/Feeding: Chair Lower Body Dressing: Moderate assistance Where Assessed - Lower Body Dressing: Unsupported sit to stand (only able to cross LT LE) Toilet Transfer: Modified independent Toilet Transfer Method: Sit to stand Toilet Transfer Equipment: Raised toilet seat with arms (or 3-in-1 over toilet) Tub/Shower Transfer: Supervision/safety Tub/Shower Transfer Method: Science writer: Walk in shower Equipment Used: Gait belt;Rolling walker Transfers/Ambulation Related to ADLs: Pt ambulating with RW MOD I without deficits. PT cued to maintain back precautions by turning with RW to prevent twisting ADL Comments: Pt educated on adls with back precautions, home set up , transfer in and out of car and bed mobility. Pt with 3 out 3 recall of precautions. Pt progressing well and has strong family support. Pt with no further questions. 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" Patient Stated Goal: to go home today   Prior Functioning     Home Living Lives With: Spouse Available Help at Discharge: Family Type of Home: House Home Access: Stairs to enter [employer] of Steps: 5 Entrance Stairs-Rails: Right Home Layout: One level Bathroom Shower/Tub: Walk-in shower;Door Dentist: None Prior Function Level of Independence: Independent Able to Take Stairs?: Yes Vocation:  (works as Lawyer) Musician: No difficulties Dominant Hand: Left         Vision/Perception Vision - History Baseline Vision: No visual deficits Patient Visual Report: No change from baseline   Cognition  Cognition Arousal/Alertness: Awake/alert Behavior During Therapy: WFL for tasks assessed/performed Overall Cognitive Status: Within Functional Limits for tasks assessed    Extremity/Trunk Assessment Right Upper Extremity Assessment RUE ROM/Strength/Tone: Within functional levels;Due to precautions RUE Sensation: WFL - Light Touch RUE Coordination: WFL - gross/fine motor Left Upper Extremity Assessment LUE ROM/Strength/Tone: WFL for tasks assessed;Due to precautions LUE Sensation: WFL - Light Touch LUE Coordination: WFL - gross/fine motor Trunk Assessment Trunk Assessment: Normal     Mobility Bed Mobility Bed Mobility: Supine to Sit;Sitting - Scoot to Edge of Bed;Sit to Supine Supine to Sit: 7: Independent;HOB flat (bed elevated ) Sitting - Scoot to Edge of Bed: 7: Independent Sit to Supine: 7: Independent;HOB flat Details for Bed Mobility Assistance: Pt with bed elevated > 36 inches to simulate surface at home. pt able to complete and maintain precautions Transfers Transfers: Sit to Stand;Stand to Sit Sit to  Stand: 6: Modified independent (Device/Increase time);From elevated surface;From chair/3-in-1 Stand to Sit: 6: Modified independent (Device/Increase  time);With upper extremity assist;To chair/3-in-1 Details for Transfer Assistance: good hand placement and husband educated on positioning to guard     Exercise Other Exercises Other Exercises: Pt educated on healing process 8 - 12 week period of time. Importance of maintaining precautions for proper healing. Pt educated on the need to decr smoking and caffeine to promote bone growth.  Other Exercises: pt agreeable to decr amount of smoking however pt did not agree to quit.   Balance     End of Session OT - End of Session Activity Tolerance: Patient tolerated treatment well Patient left: in chair;with call bell/phone within reach;with family/visitor present Nurse Communication: Mobility status;Precautions  GO     Lucile Shutters 05/26/2012, 10:29 AM Pager: [phone]

## 2012-06-05 NOTE — Anesthesia Postprocedure Evaluation (Signed)
Anesthesia Post Note  Patient: Ashley Reilly  Procedure(s) Performed: Procedure(s) (LRB): Right Lumbar four-five Microdiskectomy  (Right)  Anesthesia type: General  Patient location: PACU  Post pain: Pain level controlled and Adequate analgesia  Post assessment: Post-op Vital signs reviewed, Patient's Cardiovascular Status Stable, Respiratory Function Stable, Patent Airway and Pain level controlled  Last Vitals:  Filed Vitals:   05/26/12 1232  BP: 118/68  Pulse: 82  Temp: 36.4 C  Resp: 18    Post vital signs: Reviewed and stable  Level of consciousness: awake, alert  and oriented  Complications: No apparent anesthesia complications

## 2012-06-27 ENCOUNTER — Other Ambulatory Visit: Payer: Self-pay | Admitting: Neurosurgery

## 2012-06-27 DIAGNOSIS — M549 Dorsalgia, unspecified: Secondary | ICD-10-CM

## 2012-06-28 ENCOUNTER — Ambulatory Visit
Admission: RE | Admit: 2012-06-28 | Discharge: 2012-06-28 | Disposition: A | Discharge status: Home / Self Care | Payer: BC Managed Care – PPO | Source: Ambulatory Visit | Attending: Neurosurgery | Admitting: Neurosurgery

## 2012-06-28 VITALS — BP 125/72 | HR 100

## 2012-06-28 DIAGNOSIS — M549 Dorsalgia, unspecified: Secondary | ICD-10-CM

## 2012-06-28 MED ORDER — IOHEXOL 180 MG/ML  SOLN
1.0000 mL | Freq: Once | INTRAMUSCULAR | Status: AC | PRN
  Administered 2012-06-28: 1 mL via EPIDURAL

## 2012-06-28 MED ORDER — METHYLPREDNISOLONE ACETATE 40 MG/ML INJ SUSP (RADIOLOG
120.0000 mg | Freq: Once | INTRAMUSCULAR | Status: AC
  Administered 2012-06-28: 120 mg via EPIDURAL

## 2012-08-24 ENCOUNTER — Other Ambulatory Visit: Payer: Self-pay | Admitting: Neurosurgery

## 2012-08-24 DIAGNOSIS — M5126 Other intervertebral disc displacement, lumbar region: Secondary | ICD-10-CM

## 2012-08-30 ENCOUNTER — Ambulatory Visit
Admission: RE | Admit: 2012-08-30 | Discharge: 2012-08-30 | Disposition: A | Discharge status: Home / Self Care | Payer: BC Managed Care – PPO | Source: Ambulatory Visit | Attending: Neurosurgery | Admitting: Neurosurgery

## 2012-08-30 DIAGNOSIS — M5126 Other intervertebral disc displacement, lumbar region: Secondary | ICD-10-CM

## 2012-08-30 MED ORDER — GADOBENATE DIMEGLUMINE 529 MG/ML IV SOLN
13.0000 mL | Freq: Once | INTRAVENOUS | Status: AC | PRN
  Administered 2012-08-30: 13 mL via INTRAVENOUS

## 2013-01-05 HISTORY — PX: INCONTINENCE SURGERY: SHX676

## 2013-03-16 ENCOUNTER — Other Ambulatory Visit: Payer: Self-pay | Admitting: Neurosurgery

## 2013-03-16 DIAGNOSIS — M5416 Radiculopathy, lumbar region: Secondary | ICD-10-CM

## 2013-04-14 ENCOUNTER — Ambulatory Visit
Admission: RE | Admit: 2013-04-14 | Discharge: 2013-04-14 | Disposition: A | Discharge status: Home / Self Care | Payer: BC Managed Care – PPO | Source: Ambulatory Visit | Attending: Neurosurgery | Admitting: Neurosurgery

## 2013-04-14 VITALS — BP 90/54 | HR 82

## 2013-04-14 DIAGNOSIS — M5416 Radiculopathy, lumbar region: Secondary | ICD-10-CM

## 2013-04-14 MED ORDER — MEPERIDINE HCL 100 MG/ML IJ SOLN
100.0000 mg | Freq: Once | INTRAMUSCULAR | Status: AC
  Administered 2013-04-14: 100 mg via INTRAMUSCULAR

## 2013-04-14 MED ORDER — DIAZEPAM 5 MG PO TABS
10.0000 mg | ORAL_TABLET | Freq: Once | ORAL | Status: AC
  Administered 2013-04-14: 10 mg via ORAL

## 2013-04-14 MED ORDER — ONDANSETRON HCL 4 MG/2ML IJ SOLN
4.0000 mg | Freq: Once | INTRAMUSCULAR | Status: AC
  Administered 2013-04-14: 4 mg via INTRAMUSCULAR

## 2013-04-14 MED ORDER — IOHEXOL 180 MG/ML  SOLN: 15.0000 mL | Freq: Once | INTRAMUSCULAR | Status: DC | PRN

## 2013-04-14 NOTE — Discharge Instructions (Addendum)
Myelogram Discharge Instructions  1. Go home and rest quietly for the next 24 hours.  It is important to lie flat for the next 24 hours.  Get up only to go to the restroom.  You may lie in the bed or on a couch on your back, your stomach, your left side or your right side.  You may have one pillow under your head.  You may have pillows between your knees while you are on your side or under your knees while you are on your back.  2. DO NOT drive today.  Recline the seat as far back as it will go, while still wearing your seat belt, on the way home.  3. You may get up to go to the bathroom as needed.  You may sit up for 10 minutes to eat.  You may resume your normal diet and medications unless otherwise indicated.  Drink lots of extra fluids today and tomorrow.  4. The incidence of headache, nausea, or vomiting is about 5% (one in 20 patients).  If you develop a headache, lie flat and drink plenty of fluids until the headache goes away.  Caffeinated beverages may be helpful.  If you develop severe nausea and vomiting or a headache that does not go away with flat bed rest, call [phone].  5. You may resume normal activities after your 24 hours of bed rest is over; however, do not exert yourself strongly or do any heavy lifting tomorrow. If when you get up you have a headache when standing, go back to bed and force fluids for another 24 hours.  6. Call your physician for a follow-up appointment.  The results of your myelogram will be sent directly to your physician by the following day.  7. If you have any questions or if complications develop after you arrive home, please call [phone].  Discharge instructions have been explained to the patient.  The patient, or the person responsible for the patient, fully understands these instructions.      May resume sertraline on April 15, 2013, after 1:00 pm.

## 2013-04-14 NOTE — Progress Notes (Signed)
Pt states she has been off sertraline for the past 2 days. Discharge instructions explained to pt. 

## 2013-09-21 ENCOUNTER — Other Ambulatory Visit: Payer: Self-pay | Admitting: Neurosurgery

## 2013-09-26 ENCOUNTER — Encounter (HOSPITAL_COMMUNITY): Payer: Self-pay | Admitting: Pharmacy Technician

## 2013-09-27 ENCOUNTER — Other Ambulatory Visit (HOSPITAL_COMMUNITY): Payer: Self-pay | Admitting: *Deleted

## 2013-09-27 ENCOUNTER — Encounter (HOSPITAL_COMMUNITY): Payer: Self-pay

## 2013-09-27 ENCOUNTER — Encounter (HOSPITAL_COMMUNITY)
Admission: RE | Admit: 2013-09-27 | Discharge: 2013-09-27 | Disposition: A | Discharge status: Home / Self Care | Payer: BC Managed Care – PPO | Source: Ambulatory Visit | Attending: Neurosurgery | Admitting: Neurosurgery

## 2013-09-27 HISTORY — DX: Depression, unspecified: F32.A

## 2013-09-27 HISTORY — DX: Acute embolism and thrombosis of unspecified deep veins of unspecified lower extremity: I82.409

## 2013-09-27 HISTORY — DX: Unspecified osteoarthritis, unspecified site: M19.90

## 2013-09-27 HISTORY — DX: Thyrotoxicosis, unspecified without thyrotoxic crisis or storm: E05.90

## 2013-09-27 HISTORY — DX: Cardiac murmur, unspecified: R01.1

## 2013-09-27 HISTORY — DX: Pneumonia, unspecified organism: J18.9

## 2013-09-27 HISTORY — DX: Major depressive disorder, single episode, unspecified: F32.9

## 2013-09-27 LAB — BASIC METABOLIC PANEL
Anion gap: 13 (ref 5–15)
BUN: 12 mg/dL (ref 6–23)
CHLORIDE: 103 meq/L (ref 96–112)
CO2: 23 mEq/L (ref 19–32)
CREATININE: 0.94 mg/dL (ref 0.50–1.10)
Calcium: 9.9 mg/dL (ref 8.4–10.5)
GFR calc non Af Amer: 71 mL/min — ABNORMAL LOW (ref >90)
GFR, EST AFRICAN AMERICAN: 82 mL/min — AB (ref >90)
GLUCOSE: 113 mg/dL — AB (ref 70–99)
Potassium: 4 mEq/L (ref 3.7–5.3)
Sodium: 139 mEq/L (ref 137–147)

## 2013-09-27 LAB — SURGICAL PCR SCREEN
MRSA, PCR: NEGATIVE
Staphylococcus aureus: NEGATIVE

## 2013-09-27 LAB — TYPE AND SCREEN
ABO/RH(D): A NEG
Antibody Screen: NEGATIVE

## 2013-09-27 LAB — CBC
HCT: 37.8 % (ref 36.0–46.0)
HEMOGLOBIN: 11.8 g/dL — AB (ref 12.0–15.0)
MCH: 23.9 pg — AB (ref 26.0–34.0)
MCHC: 31.2 g/dL (ref 30.0–36.0)
MCV: 76.7 fL — AB (ref 78.0–100.0)
Platelets: 328 10*3/uL (ref 150–400)
RBC: 4.93 MIL/uL (ref 3.87–5.11)
RDW: 17.5 % — ABNORMAL HIGH (ref 11.5–15.5)
WBC: 9.7 10*3/uL (ref 4.0–10.5)

## 2013-09-27 LAB — ABO/RH: ABO/RH(D): A NEG

## 2013-09-27 NOTE — Progress Notes (Signed)
During PAT appt pt stated that she had been seen at [facility] approx 3 weeks ago for chest pain. States that she was kept overnight and had several EKG's done, CXR done and lab work done and they could not find anything wrong with her. She states she thinks that it's coming from the way she is having to walk bent over due to the back pain. She states she did call her PCP, Amelia Jo, NP and talked with her and states that she was told it was probably due to her being bent over, but if it continued or got worse to come in and be checked. She states that she has it occasionally, it's more to the right side and in her shoulders. Denies sob, nausea or diaphoresis when she has the pain. Pt also mentioned that when she had her hysterectomy done in March, 2015 the anesthesiologist told her she had a heart murmur, "but it wasn't anything to be concerned about, it's something people get when they age".  I called and spoke with A. Madilyn Fireman, PA and she wanted to see pt prior to pt leaving. Pt was waiting to have lab work done and I told her to wait after the labs and Revonda Standard would come and see her. When Webster came over, pt had already left. Requesting copies of ED notes, EKG, CXR and lab work from [facility].  Stop Brennan Bailey sent to PCP  Amelia Jo, NP at [facility])

## 2013-09-27 NOTE — Progress Notes (Addendum)
Anesthesia Chart Review:  Patient is a 48 year old female scheduled for L4-5 PLIF on 09/29/13 by Dr. Jeral Fruit. PAT RN asked patient to wait and talk with me before leaving PAT, but patient forgot and left after getting her labs drawn. I was able to speak with her by phone this afternoon.  History includes smoking, HTN, GERD, DM2, hyperthyroidism (on Tapzole for ~ 1 year; followed by endocrinologist Dr. Demetra Shiner), migraines, hypercholesterolemia, depression, GERD, arthritis, post-operative RLE DVT 2014 (treated with Xarelto for 7 months), hysterectomy, L4-5 microdiskectomy 05/06/12, hysterectomy 03/21/13 (under [place], [facility], see Care Everywhere), cystourethroscopy for sling for stress incontinence (07/21/13 under [place], [facility], see Care Everywhere). PCP is Amelia Jo, NP with [facility].  She reports hospitalization at [facility] approximately three weeks ago for chest pain, records are pending.  She had been at a family event and developed right sided chest pain with both arms hurting.  There was no associated diaphoresis, N/V, palpitations, new SOB.  She reports staying overnight and being told that she ruled out for MI and lung infection.  There was some associated chest wall tenderness. She reports that due to increasing back pain, she has had to walk hunched over.  This is putting strain on her arms and chest which she feels contributed to her symptoms. She reports notifying her PCP of hospitalization and being told to follow-up if she had recurrent symptoms which she has not.  She has not had an formal PCP follow-up appointment.  She reports excellent control of her DM, with reported last A1C of 5.7.   Meds: Vitamin D, Valium, Neurontin, lisinopril, metformin, Tapazole, Prilosec, Opana, Pravachol, Zoloft.  EKG on 05/23/12 showed NSR. Records in Care Everywhere indicated 03/18/13 EKG showed NSR as well.  Anesthesia records from 03/2013 in Care Everywhere indicate that a 2/6 SEM was heard at the  [facility]. Most recent EKG from [facility] is still pending.   Preoperative labs noted.  TSH 3.01 and free Tr 0.57 on 07/18/13 [facility]; see Care Everywhere).  Records are still pending form [facility]--EKG, CXR,and discharge summary.  Known information has been reviewed with anesthesiologist Dr. Jacklynn Bue.  She denies recurrent CV symptoms. She has tolerated two surgical procedures this year. If her EKG and additional records from [facility] are unremarkable then it is anticipated that she can proceed as planned if no new changes.  Chart will be left for follow-up so myself or Rica Mast, FNP-C can review [facility] records once received.  Velna Ochs [facility] Short Stay Center/Anesthesiology Phone [phone] 09/27/2013 5:45 PM  Addendum:   Review of records from [facility] visit 09/10/13.   CXR: 1 view, normal exam.   No EKG in records sent. Notes state EKG normal, series of 3 cardiac enzymes normal, D-Dimer normal. Acute coronary syndrome ruled out.   If no changes, I anticipate pt can proceed with surgery as scheduled.   Rica Mast, FNP-BC [facility] Short Stay Surgical Center/Anesthesiology Phone: [phone] 09/28/2013 11:11 AM

## 2013-09-27 NOTE — Progress Notes (Signed)
09/27/13 1420  OBSTRUCTIVE SLEEP APNEA  Have you ever been diagnosed with sleep apnea through a sleep study? No  Do you snore loudly (loud enough to be heard through closed doors)?  0  Do you often feel tired, fatigued, or sleepy during the daytime? 1  Has anyone observed you stop breathing during your sleep? 0  Do you have, or are you being treated for high blood pressure? 1  BMI more than 35 kg/m2? 1  Age over 47 years old? 1  Neck circumference greater than 40 cm/16 inches? 1  Gender: 0  Obstructive Sleep Apnea Score 5  Score 4 or greater  Results sent to PCP

## 2013-09-27 NOTE — Pre-Procedure Instructions (Signed)
Ashley Reilly  09/27/2013   Your procedure is scheduled on:  Friday, September 29, 2013 at 12:55 PM.   Report to [facility] Entrance "A" Admitting Office at 9:45 AM.   Call this number if you have problems the morning of surgery: [phone]   Remember:   Do not eat food or drink liquids after midnight Thursday, 09/28/13.   Take these medicines the morning of surgery with A SIP OF WATER: gabapentin (NEURONTIN), methimazole (TAPAZOLE), omeprazole (PRILOSEC), sertraline (ZOLOFT), oxymorphone (OPANA) - if needed, diazepam (VALIUM) - if needed.    Do not wear jewelry, make-up or nail polish.  Do not wear lotions, powders, or perfumes. You may wear deodorant.  Do not shave 48 hours prior to surgery.   Do not bring valuables to the hospital.  [facility] is not responsible                  for any belongings or valuables.               Contacts, dentures or bridgework may not be worn into surgery.  Leave suitcase in the car. After surgery it may be brought to your room.  For patients admitted to the hospital, discharge time is determined by your                treatment team.             Special Instructions: [place] - Preparing for Surgery  Before surgery, you can play an important role.  Because skin is not sterile, your skin needs to be as free of germs as possible.  You can reduce the number of germs on you skin by washing with CHG (chlorahexidine gluconate) soap before surgery.  CHG is an antiseptic cleaner which kills germs and bonds with the skin to continue killing germs even after washing.  Please DO NOT use if you have an allergy to CHG or antibacterial soaps.  If your skin becomes reddened/irritated stop using the CHG and inform your nurse when you arrive at Short Stay.  Do not shave (including legs and underarms) for at least 48 hours prior to the first CHG shower.  You may shave your face.  Please follow these instructions carefully:   1.  Shower with CHG Soap  the night before surgery and the                                morning of Surgery.  2.  If you choose to wash your hair, wash your hair first as usual with your       normal shampoo.  3.  After you shampoo, rinse your hair and body thoroughly to remove the                      Shampoo.  4.  Use CHG as you would any other liquid soap.  You can apply chg directly       to the skin and wash gently with scrungie or a clean washcloth.  5.  Apply the CHG Soap to your body ONLY FROM THE NECK DOWN.        Do not use on open wounds or open sores.  Avoid contact with your eyes, ears, mouth and genitals (private parts).  Wash genitals (private parts) with your normal soap.  6.  Wash thoroughly, paying special attention to the area where your  surgery        will be performed.  7.  Thoroughly rinse your body with warm water from the neck down.  8.  DO NOT shower/wash with your normal soap after using and rinsing off       the CHG Soap.  9.  Pat yourself dry with a clean towel.            10.  Wear clean pajamas.            11.  Place clean sheets on your bed the night of your first shower and do not        sleep with pets.  Day of Surgery  Do not apply any lotions the morning of surgery.  Please wear clean clothes to the hospital/surgery center.     Please read over the following fact sheets that you were given: Pain Booklet, Coughing and Deep Breathing, Blood Transfusion Information, MRSA Information and Surgical Site Infection Prevention

## 2013-09-28 ENCOUNTER — Ambulatory Visit: Payer: Self-pay | Admitting: Neurosurgery

## 2013-09-28 MED ORDER — CEFAZOLIN SODIUM-DEXTROSE 2-3 GM-% IV SOLR
2.0000 g | INTRAVENOUS | Status: AC
  Administered 2013-09-29: 2 g via INTRAVENOUS
  Filled 2013-09-28: qty 50

## 2013-09-28 NOTE — H&P (Signed)
Ashley Reilly is an 48 y.o. female.   Chief Complaint:lumbar pain HPI: patient who in the past had lumbar discectomy with some relief of the pain but lately the pain is getting worse  With radiation to the right leg. She has failed with conservative treatment.  Past Medical History  Diagnosis Date  . Hypertension   . Neuromuscular disorder     rt leg tingling/numbness  . GERD (gastroesophageal reflux disease)   . High cholesterol   . Type II diabetes mellitus   . Daily headache   . Migraines     "couple times a month here lately" (05/25/2012)  . Chronic lower back pain     "my disc is messed up" (05/25/2012)  . DVT (deep venous thrombosis)     right leg, was on Xarelto for 7 months  . Pneumonia   . Hyperthyroidism   . Depression   . Arthritis     degenerative disc disease, right hip  . Heart murmur     reports anesthesiologist at [facility] told her she has a "little" murmur    Past Surgical History  Procedure Laterality Date  . Incontinence surgery  ~ 2004    "benign tumors removed; sling placed" (05/25/2012)  . Carpal tunnel release Left ~ 2002  . Cholecystectomy  ~ 2000  . Tubal ligation  1990  . Collagen injection      "put into the walls of bladder cause my sling is failing" (05/25/2012)  . Lumbar disc surgery  05/25/2012    "L5" (05/25/2012)  . Lumbar laminectomy/decompression microdiscectomy Right 05/25/2012    Procedure: Right Lumbar four-five Microdiskectomy ;  Surgeon: [name], MD;  Location: [facility] OR[facility];  Service: Neurosurgery;  Laterality: Right;  Right Lumbar four-five Microdiskectomy  . Incontinence surgery  2015  . Abdominal hysterectomy      Family History  Problem Relation Age of Onset  . Melanoma Mother    Social History:  reports that she has been smoking Cigarettes.  She has a 68 pack-year smoking history. She has never used smokeless tobacco. She reports that she does not drink alcohol or use illicit drugs.  Allergies:  Allergies  Allergen  Reactions  . Tylenol [Acetaminophen]     Sick to stomach, throws up     (Not in a hospital admission)  Results for orders placed during the hospital encounter of 09/27/13 (from the past 48 hour(s))  SURGICAL PCR SCREEN     Status: None   Collection Time    09/27/13  2:53 PM      Result Value Ref Range   MRSA, PCR NEGATIVE  NEGATIVE   Staphylococcus aureus NEGATIVE  NEGATIVE   Comment:            The Xpert SA Assay (FDA     approved for NASAL specimens     in patients over 1 years of age),     is one component of     a comprehensive surveillance     program.  Test performance has     been validated by [employer] for patients greater     than or equal to 74 year old.     It is not intended     to diagnose infection nor to     guide or monitor treatment.  BASIC METABOLIC PANEL     Status: Abnormal   Collection Time    09/27/13  3:00 PM      Result Value  Ref Range   Sodium 139  137 - 147 mEq/L   Potassium 4.0  3.7 - 5.3 mEq/L   Chloride 103  96 - 112 mEq/L   CO2 23  19 - 32 mEq/L   Glucose, Bld 113 (*) 70 - 99 mg/dL   BUN 12  6 - 23 mg/dL   Creatinine, Ser 0.94  0.50 - 1.10 mg/dL   Calcium 9.9  8.4 - 10.5 mg/dL   GFR calc non Af Amer 71 (*) >90 mL/min   GFR calc Af Amer 82 (*) >90 mL/min   Comment: (NOTE)     The eGFR has been calculated using the CKD EPI equation.     This calculation has not been validated in all clinical situations.     eGFR's persistently <90 mL/min signify possible Chronic Kidney     Disease.   Anion gap 13  5 - 15  CBC     Status: Abnormal   Collection Time    09/27/13  3:00 PM      Result Value Ref Range   WBC 9.7  4.0 - 10.5 K/uL   RBC 4.93  3.87 - 5.11 MIL/uL   Hemoglobin 11.8 (*) 12.0 - 15.0 g/dL   HCT 37.8  36.0 - 46.0 %   MCV 76.7 (*) 78.0 - 100.0 fL   MCH 23.9 (*) 26.0 - 34.0 pg   MCHC 31.2  30.0 - 36.0 g/dL   RDW 17.5 (*) 11.5 - 15.5 %   Platelets 328  150 - 400 K/uL  TYPE AND SCREEN     Status: None   Collection Time     09/27/13  3:04 PM      Result Value Ref Range   ABO/RH(D) A NEG     Antibody Screen NEG     Sample Expiration 10/11/2013    ABO/RH     Status: None   Collection Time    09/27/13  3:04 PM      Result Value Ref Range   ABO/RH(D) A NEG     No results found.  Review of Systems  Constitutional: Negative.   HENT: Negative.   Respiratory: Negative.   Cardiovascular: Negative.   Gastrointestinal: Negative.   Genitourinary: Negative.   Skin: Negative.   Neurological: Positive for sensory change and focal weakness.  Endo/Heme/Allergies: Negative.   Psychiatric/Behavioral: Negative.     Last menstrual period 05/19/2012. Physical Exam hent, nl. Neck, nl. Cv, nl. Lungs, clear. Abdomen, soft extremities, nl NEUR[facility]  Weakness od df in the right foot. SLR positive at 30 degrees on the right . Sensory, numbness l4 dermatome. Lumbar myelogram shows spondylolisthesis at l4-5 with osteophyte compromising the l4 nerve root  Assessment/Plan Decompression and fusion at l4-5. She and her husband are aware of risks and benefits  Tomaz Janis M 09/28/2013, 3:22 PM

## 2013-09-29 ENCOUNTER — Inpatient Hospital Stay (HOSPITAL_COMMUNITY)
Admission: RE | Admit: 2013-09-29 | Discharge: 2013-10-01 | DRG: 460 | Disposition: A | Discharge status: Home / Self Care | Payer: BC Managed Care – PPO | Source: Ambulatory Visit | Attending: Neurosurgery | Admitting: Neurosurgery

## 2013-09-29 ENCOUNTER — Encounter (HOSPITAL_COMMUNITY): Payer: Self-pay | Admitting: Anesthesiology

## 2013-09-29 ENCOUNTER — Ambulatory Visit (HOSPITAL_COMMUNITY): Payer: BC Managed Care – PPO

## 2013-09-29 ENCOUNTER — Encounter (HOSPITAL_COMMUNITY)
Admission: RE | Disposition: A | Discharge status: Home / Self Care | Payer: BC Managed Care – PPO | Source: Ambulatory Visit | Attending: Neurosurgery

## 2013-09-29 ENCOUNTER — Ambulatory Visit: Payer: Self-pay | Admitting: Neurosurgery

## 2013-09-29 ENCOUNTER — Ambulatory Visit (HOSPITAL_COMMUNITY): Payer: BC Managed Care – PPO | Admitting: Anesthesiology

## 2013-09-29 ENCOUNTER — Other Ambulatory Visit: Payer: Self-pay | Admitting: Neurosurgery

## 2013-09-29 ENCOUNTER — Encounter (HOSPITAL_COMMUNITY): Payer: BC Managed Care – PPO | Admitting: Vascular Surgery

## 2013-09-29 DIAGNOSIS — E0590 Thyrotoxicosis, unspecified without thyrotoxic crisis or storm: Secondary | ICD-10-CM | POA: Diagnosis present

## 2013-09-29 DIAGNOSIS — I10 Essential (primary) hypertension: Secondary | ICD-10-CM | POA: Diagnosis present

## 2013-09-29 DIAGNOSIS — G8929 Other chronic pain: Secondary | ICD-10-CM | POA: Diagnosis present

## 2013-09-29 DIAGNOSIS — E119 Type 2 diabetes mellitus without complications: Secondary | ICD-10-CM | POA: Diagnosis present

## 2013-09-29 DIAGNOSIS — M4310 Spondylolisthesis, site unspecified: Principal | ICD-10-CM | POA: Diagnosis present

## 2013-09-29 DIAGNOSIS — M545 Low back pain, unspecified: Secondary | ICD-10-CM | POA: Diagnosis present

## 2013-09-29 DIAGNOSIS — F17200 Nicotine dependence, unspecified, uncomplicated: Secondary | ICD-10-CM | POA: Diagnosis present

## 2013-09-29 DIAGNOSIS — M4316 Spondylolisthesis, lumbar region: Secondary | ICD-10-CM | POA: Diagnosis present

## 2013-09-29 LAB — GLUCOSE, CAPILLARY: Glucose-Capillary: 112 mg/dL — ABNORMAL HIGH (ref 70–99)

## 2013-09-29 SURGERY — POSTERIOR LUMBAR FUSION 1 LEVEL
Anesthesia: General | Site: Back

## 2013-09-29 MED ORDER — HYDROMORPHONE HCL 1 MG/ML IJ SOLN
INTRAMUSCULAR | Status: AC
  Filled 2013-09-29: qty 1

## 2013-09-29 MED ORDER — NALOXONE HCL 0.4 MG/ML IJ SOLN: 0.4000 mg | INTRAMUSCULAR | Status: DC | PRN

## 2013-09-29 MED ORDER — OXYCODONE HCL 5 MG/5ML PO SOLN: 5.0000 mg | Freq: Once | ORAL | Status: AC | PRN

## 2013-09-29 MED ORDER — GLYCOPYRROLATE 0.2 MG/ML IJ SOLN
INTRAMUSCULAR | Status: AC
  Filled 2013-09-29: qty 3

## 2013-09-29 MED ORDER — DEXAMETHASONE SODIUM PHOSPHATE 4 MG/ML IJ SOLN
INTRAMUSCULAR | Status: AC
  Filled 2013-09-29: qty 2

## 2013-09-29 MED ORDER — OXYCODONE-ACETAMINOPHEN 5-325 MG PO TABS: 1.0000 | ORAL_TABLET | ORAL | Status: DC | PRN

## 2013-09-29 MED ORDER — VANCOMYCIN HCL 1000 MG IV SOLR
INTRAVENOUS | Status: AC
  Filled 2013-09-29: qty 1000

## 2013-09-29 MED ORDER — PROPOFOL 10 MG/ML IV BOLUS
INTRAVENOUS | Status: DC | PRN
  Administered 2013-09-29: 200 mg via INTRAVENOUS

## 2013-09-29 MED ORDER — ARTIFICIAL TEARS OP OINT
TOPICAL_OINTMENT | OPHTHALMIC | Status: AC
  Filled 2013-09-29: qty 3.5

## 2013-09-29 MED ORDER — DIPHENHYDRAMINE HCL 50 MG/ML IJ SOLN: 12.5000 mg | Freq: Four times a day (QID) | INTRAMUSCULAR | Status: DC | PRN

## 2013-09-29 MED ORDER — VECURONIUM BROMIDE 10 MG IV SOLR
INTRAVENOUS | Status: AC
  Filled 2013-09-29: qty 10

## 2013-09-29 MED ORDER — DIAZEPAM 5 MG PO TABS
ORAL_TABLET | ORAL | Status: AC
  Filled 2013-09-29: qty 1

## 2013-09-29 MED ORDER — SURGIFOAM 100 EX MISC
CUTANEOUS | Status: DC | PRN
  Administered 2013-09-29: 14:00:00 via TOPICAL

## 2013-09-29 MED ORDER — ARTIFICIAL TEARS OP OINT
TOPICAL_OINTMENT | OPHTHALMIC | Status: DC | PRN
  Administered 2013-09-29: 1 via OPHTHALMIC

## 2013-09-29 MED ORDER — SERTRALINE HCL 50 MG PO TABS
50.0000 mg | ORAL_TABLET | Freq: Every day | ORAL | Status: DC
Start: 2013-09-30 — End: 2013-10-01
  Administered 2013-09-30 – 2013-10-01 (×2): 50 mg via ORAL
  Filled 2013-09-29 (×2): qty 1

## 2013-09-29 MED ORDER — ONDANSETRON HCL 4 MG/2ML IJ SOLN: 4.0000 mg | Freq: Four times a day (QID) | INTRAMUSCULAR | Status: DC | PRN

## 2013-09-29 MED ORDER — LACTATED RINGERS IV SOLN
INTRAVENOUS | Status: DC
  Administered 2013-09-29: 10:00:00 via INTRAVENOUS

## 2013-09-29 MED ORDER — METFORMIN HCL ER 500 MG PO TB24
1000.0000 mg | ORAL_TABLET | Freq: Every day | ORAL | Status: DC
  Administered 2013-09-30: 1000 mg via ORAL
  Filled 2013-09-29 (×2): qty 2

## 2013-09-29 MED ORDER — MIDAZOLAM HCL 2 MG/2ML IJ SOLN
INTRAMUSCULAR | Status: AC
  Filled 2013-09-29: qty 2

## 2013-09-29 MED ORDER — NEOSTIGMINE METHYLSULFATE 10 MG/10ML IV SOLN
INTRAVENOUS | Status: AC
  Filled 2013-09-29: qty 1

## 2013-09-29 MED ORDER — PHENYLEPHRINE HCL 10 MG/ML IJ SOLN
INTRAMUSCULAR | Status: DC | PRN
  Administered 2013-09-29 (×2): 80 ug via INTRAVENOUS

## 2013-09-29 MED ORDER — LACTATED RINGERS IV SOLN
INTRAVENOUS | Status: DC | PRN
  Administered 2013-09-29 (×2): via INTRAVENOUS

## 2013-09-29 MED ORDER — FENTANYL 10 MCG/ML IV SOLN
INTRAVENOUS | Status: DC
Start: 2013-09-29 — End: 2013-09-29
  Administered 2013-09-29: 210 ug via INTRAVENOUS
  Administered 2013-09-29: 16:00:00 via INTRAVENOUS
  Filled 2013-09-29 (×2): qty 50

## 2013-09-29 MED ORDER — NEOSTIGMINE METHYLSULFATE 10 MG/10ML IV SOLN
INTRAVENOUS | Status: DC | PRN
  Administered 2013-09-29: 5 mg via INTRAVENOUS

## 2013-09-29 MED ORDER — 0.9 % SODIUM CHLORIDE (POUR BTL) OPTIME
TOPICAL | Status: DC | PRN
  Administered 2013-09-29: 1000 mL

## 2013-09-29 MED ORDER — SODIUM CHLORIDE 0.9 % IJ SOLN: 3.0000 mL | INTRAMUSCULAR | Status: DC | PRN

## 2013-09-29 MED ORDER — METHIMAZOLE 5 MG PO TABS
15.0000 mg | ORAL_TABLET | Freq: Every day | ORAL | Status: DC
  Administered 2013-09-30 – 2013-10-01 (×2): 15 mg via ORAL
  Filled 2013-09-29 (×2): qty 1

## 2013-09-29 MED ORDER — CEFAZOLIN SODIUM 1-5 GM-% IV SOLN
1.0000 g | Freq: Three times a day (TID) | INTRAVENOUS | Status: AC
  Administered 2013-09-29 – 2013-09-30 (×2): 1 g via INTRAVENOUS
  Filled 2013-09-29 (×2): qty 50

## 2013-09-29 MED ORDER — HYDROMORPHONE HCL 1 MG/ML IJ SOLN
0.2500 mg | INTRAMUSCULAR | Status: DC | PRN
  Administered 2013-09-29 (×4): 0.5 mg via INTRAVENOUS

## 2013-09-29 MED ORDER — ACETAMINOPHEN 325 MG PO TABS: 650.0000 mg | ORAL_TABLET | ORAL | Status: DC | PRN

## 2013-09-29 MED ORDER — KETOROLAC TROMETHAMINE 30 MG/ML IJ SOLN
30.0000 mg | Freq: Four times a day (QID) | INTRAMUSCULAR | Status: DC
  Administered 2013-09-30 – 2013-10-01 (×6): 30 mg via INTRAVENOUS
  Filled 2013-09-29 (×6): qty 1

## 2013-09-29 MED ORDER — EPHEDRINE SULFATE 50 MG/ML IJ SOLN
INTRAMUSCULAR | Status: DC | PRN
  Administered 2013-09-29 (×2): 10 mg via INTRAVENOUS

## 2013-09-29 MED ORDER — PHENOL 1.4 % MT LIQD
1.0000 | OROMUCOSAL | Status: DC | PRN
Start: 2013-09-29 — End: 2013-10-01
  Filled 2013-09-29: qty 177

## 2013-09-29 MED ORDER — ZOLPIDEM TARTRATE 5 MG PO TABS: 5.0000 mg | ORAL_TABLET | Freq: Every evening | ORAL | Status: DC | PRN

## 2013-09-29 MED ORDER — SODIUM CHLORIDE 0.9 % IJ SOLN
INTRAMUSCULAR | Status: AC
  Filled 2013-09-29: qty 10

## 2013-09-29 MED ORDER — LISINOPRIL 10 MG PO TABS: 10.0000 mg | ORAL_TABLET | Freq: Every morning | ORAL | Status: DC

## 2013-09-29 MED ORDER — PANTOPRAZOLE SODIUM 40 MG PO TBEC
40.0000 mg | DELAYED_RELEASE_TABLET | Freq: Every day | ORAL | Status: DC
Start: 2013-09-30 — End: 2013-09-30
  Administered 2013-09-30: 40 mg via ORAL
  Filled 2013-09-29: qty 1

## 2013-09-29 MED ORDER — GLYCOPYRROLATE 0.2 MG/ML IJ SOLN
INTRAMUSCULAR | Status: DC | PRN
  Administered 2013-09-29: 0.6 mg via INTRAVENOUS

## 2013-09-29 MED ORDER — VANCOMYCIN HCL 1000 MG IV SOLR
INTRAVENOUS | Status: DC | PRN
  Administered 2013-09-29: 1000 mg via TOPICAL

## 2013-09-29 MED ORDER — MENTHOL 3 MG MT LOZG
1.0000 | LOZENGE | OROMUCOSAL | Status: DC | PRN
  Filled 2013-09-29: qty 9

## 2013-09-29 MED ORDER — ACETAMINOPHEN 650 MG RE SUPP: 650.0000 mg | RECTAL | Status: DC | PRN

## 2013-09-29 MED ORDER — HYDROMORPHONE HCL 1 MG/ML IJ SOLN
1.0000 mg | INTRAMUSCULAR | Status: DC | PRN
  Administered 2013-09-29 – 2013-09-30 (×8): 1 mg via INTRAVENOUS
  Filled 2013-09-29 (×8): qty 1

## 2013-09-29 MED ORDER — DIPHENHYDRAMINE HCL 12.5 MG/5ML PO ELIX: 12.5000 mg | ORAL_SOLUTION | Freq: Four times a day (QID) | ORAL | Status: DC | PRN

## 2013-09-29 MED ORDER — LIDOCAINE HCL (CARDIAC) 20 MG/ML IV SOLN
INTRAVENOUS | Status: AC
  Filled 2013-09-29: qty 5

## 2013-09-29 MED ORDER — PROPOFOL 10 MG/ML IV BOLUS
INTRAVENOUS | Status: AC
  Filled 2013-09-29: qty 20

## 2013-09-29 MED ORDER — ONDANSETRON HCL 4 MG/2ML IJ SOLN
INTRAMUSCULAR | Status: AC
  Filled 2013-09-29: qty 2

## 2013-09-29 MED ORDER — SCOPOLAMINE 1 MG/3DAYS TD PT72
MEDICATED_PATCH | TRANSDERMAL | Status: DC | PRN
  Administered 2013-09-29: 1 via TRANSDERMAL

## 2013-09-29 MED ORDER — OXYCODONE HCL 5 MG PO TABS
ORAL_TABLET | ORAL | Status: AC
  Filled 2013-09-29: qty 1

## 2013-09-29 MED ORDER — SODIUM CHLORIDE 0.9 % IV SOLN: 250.0000 mL | INTRAVENOUS | Status: DC

## 2013-09-29 MED ORDER — ONDANSETRON HCL 4 MG/2ML IJ SOLN
INTRAMUSCULAR | Status: DC | PRN
  Administered 2013-09-29: 4 mg via INTRAVENOUS

## 2013-09-29 MED ORDER — ONDANSETRON HCL 4 MG/2ML IJ SOLN: 4.0000 mg | INTRAMUSCULAR | Status: DC | PRN

## 2013-09-29 MED ORDER — MIDAZOLAM HCL 5 MG/5ML IJ SOLN
INTRAMUSCULAR | Status: DC | PRN
  Administered 2013-09-29: 2 mg via INTRAVENOUS

## 2013-09-29 MED ORDER — PROMETHAZINE HCL 25 MG/ML IJ SOLN: 6.2500 mg | INTRAMUSCULAR | Status: DC | PRN

## 2013-09-29 MED ORDER — SUFENTANIL CITRATE 50 MCG/ML IV SOLN
INTRAVENOUS | Status: AC
  Filled 2013-09-29: qty 1

## 2013-09-29 MED ORDER — ALBUMIN HUMAN 5 % IV SOLN
INTRAVENOUS | Status: DC | PRN
  Administered 2013-09-29: 14:00:00 via INTRAVENOUS

## 2013-09-29 MED ORDER — SIMVASTATIN 10 MG PO TABS
10.0000 mg | ORAL_TABLET | Freq: Every day | ORAL | Status: DC
  Administered 2013-09-30: 10 mg via ORAL
  Filled 2013-09-29 (×2): qty 1

## 2013-09-29 MED ORDER — SODIUM CHLORIDE 0.9 % IV SOLN
INTRAVENOUS | Status: DC
  Administered 2013-09-29 (×2): via INTRAVENOUS

## 2013-09-29 MED ORDER — SODIUM CHLORIDE 0.9 % IJ SOLN: 9.0000 mL | INTRAMUSCULAR | Status: DC | PRN

## 2013-09-29 MED ORDER — VECURONIUM BROMIDE 10 MG IV SOLR
INTRAVENOUS | Status: DC | PRN
Start: 2013-09-29 — End: 2013-09-29
  Administered 2013-09-29: 2 mg via INTRAVENOUS

## 2013-09-29 MED ORDER — LIDOCAINE HCL (CARDIAC) 20 MG/ML IV SOLN
INTRAVENOUS | Status: DC | PRN
  Administered 2013-09-29: 100 mg via INTRAVENOUS

## 2013-09-29 MED ORDER — STERILE WATER FOR INJECTION IJ SOLN
INTRAMUSCULAR | Status: AC
  Filled 2013-09-29: qty 10

## 2013-09-29 MED ORDER — OXYCODONE HCL 5 MG PO TABS
5.0000 mg | ORAL_TABLET | Freq: Once | ORAL | Status: AC | PRN
  Administered 2013-09-29: 5 mg via ORAL

## 2013-09-29 MED ORDER — SODIUM CHLORIDE 0.9 % IJ SOLN
3.0000 mL | Freq: Two times a day (BID) | INTRAMUSCULAR | Status: DC
  Administered 2013-09-30 (×2): 3 mL via INTRAVENOUS

## 2013-09-29 MED ORDER — BUPIVACAINE LIPOSOME 1.3 % IJ SUSP
20.0000 mL | Freq: Once | INTRAMUSCULAR | Status: AC
  Administered 2013-09-29: 20 mL
  Filled 2013-09-29: qty 20

## 2013-09-29 MED ORDER — SCOPOLAMINE 1 MG/3DAYS TD PT72
MEDICATED_PATCH | TRANSDERMAL | Status: AC
  Filled 2013-09-29: qty 1

## 2013-09-29 MED ORDER — SUFENTANIL CITRATE 50 MCG/ML IV SOLN
INTRAVENOUS | Status: DC | PRN
Start: 2013-09-29 — End: 2013-09-29
  Administered 2013-09-29 (×2): 10 ug via INTRAVENOUS
  Administered 2013-09-29: 5 ug via INTRAVENOUS
  Administered 2013-09-29 (×2): 10 ug via INTRAVENOUS
  Administered 2013-09-29: 5 ug via INTRAVENOUS

## 2013-09-29 MED ORDER — GABAPENTIN 600 MG PO TABS
600.0000 mg | ORAL_TABLET | Freq: Three times a day (TID) | ORAL | Status: DC
  Administered 2013-09-29 – 2013-10-01 (×5): 600 mg via ORAL
  Filled 2013-09-29 (×5): qty 1

## 2013-09-29 MED ORDER — DIAZEPAM 5 MG PO TABS
5.0000 mg | ORAL_TABLET | Freq: Four times a day (QID) | ORAL | Status: DC | PRN
  Administered 2013-09-29: 5 mg via ORAL

## 2013-09-29 MED ORDER — THROMBIN 5000 UNITS EX SOLR
OROMUCOSAL | Status: DC | PRN
  Administered 2013-09-29: 14:00:00 via TOPICAL

## 2013-09-29 MED ORDER — ROCURONIUM BROMIDE 100 MG/10ML IV SOLN
INTRAVENOUS | Status: DC | PRN
  Administered 2013-09-29: 50 mg via INTRAVENOUS

## 2013-09-29 SURGICAL SUPPLY — 76 items
APL SKNCLS STERI-STRIP NONHPOA (GAUZE/BANDAGES/DRESSINGS) ×1
BENZOIN TINCTURE PRP APPL 2/3 (GAUZE/BANDAGES/DRESSINGS) ×3 IMPLANT
BLADE SURG ROTATE 9660 (MISCELLANEOUS) IMPLANT
BUR ACORN 6.0 (BURR) ×2 IMPLANT
BUR ACORN 6.0MM (BURR) ×1
BUR MATCHSTICK NEURO 3.0 LAGG (BURR) ×3 IMPLANT
CANISTER SUCT 3000ML (MISCELLANEOUS) ×3 IMPLANT
CAP LOCKING THREADED (Cap) ×8 IMPLANT
CLOSURE WOUND 1/2 X4 (GAUZE/BANDAGES/DRESSINGS) ×1
CONT SPEC 4OZ CLIKSEAL STRL BL (MISCELLANEOUS) ×3 IMPLANT
COVER BACK TABLE 24X17X13 BIG (DRAPES) IMPLANT
COVER TABLE BACK 60X90 (DRAPES) ×3 IMPLANT
CROSSLINK SPINAL FUSION (Cage) ×2 IMPLANT
DRAPE C-ARM 42X72 X-RAY (DRAPES) ×6 IMPLANT
DRAPE LAPAROTOMY 100X72X124 (DRAPES) ×3 IMPLANT
DRAPE POUCH INSTRU U-SHP 10X18 (DRAPES) ×3 IMPLANT
DURAPREP 26ML APPLICATOR (WOUND CARE) ×3 IMPLANT
ELECT REM PT RETURN 9FT ADLT (ELECTROSURGICAL) ×3
ELECTRODE REM PT RTRN 9FT ADLT (ELECTROSURGICAL) ×1 IMPLANT
EVACUATOR 1/8 PVC DRAIN (DRAIN) IMPLANT
GAUZE SPONGE 4X4 12PLY STRL (GAUZE/BANDAGES/DRESSINGS) ×3 IMPLANT
GAUZE SPONGE 4X4 16PLY XRAY LF (GAUZE/BANDAGES/DRESSINGS) ×3 IMPLANT
GLOVE BIOGEL M 8.0 STRL (GLOVE) ×5 IMPLANT
GLOVE BIOGEL PI IND STRL 7.0 (GLOVE) IMPLANT
GLOVE BIOGEL PI IND STRL 8.5 (GLOVE) IMPLANT
GLOVE BIOGEL PI INDICATOR 7.0 (GLOVE) ×2
GLOVE BIOGEL PI INDICATOR 8.5 (GLOVE) ×4
GLOVE ECLIPSE 8.0 STRL XLNG CF (GLOVE) ×2 IMPLANT
GLOVE ECLIPSE 8.5 STRL (GLOVE) ×2 IMPLANT
GLOVE EXAM NITRILE LRG STRL (GLOVE) IMPLANT
GLOVE EXAM NITRILE MD LF STRL (GLOVE) IMPLANT
GLOVE EXAM NITRILE XL STR (GLOVE) IMPLANT
GLOVE EXAM NITRILE XS STR PU (GLOVE) IMPLANT
GLOVE SS N UNI LF 7.0 STRL (GLOVE) ×6 IMPLANT
GOWN STRL REUS W/ TWL LRG LVL3 (GOWN DISPOSABLE) ×1 IMPLANT
GOWN STRL REUS W/ TWL XL LVL3 (GOWN DISPOSABLE) IMPLANT
GOWN STRL REUS W/TWL 2XL LVL3 (GOWN DISPOSABLE) ×2 IMPLANT
GOWN STRL REUS W/TWL LRG LVL3 (GOWN DISPOSABLE) ×6
GOWN STRL REUS W/TWL XL LVL3 (GOWN DISPOSABLE) ×3
KIT BASIN OR (CUSTOM PROCEDURE TRAY) ×3 IMPLANT
KIT INFUSE MEDIUM (Orthopedic Implant) ×2 IMPLANT
KIT ROOM TURNOVER OR (KITS) ×3 IMPLANT
NDL HYPO 18GX1.5 BLUNT FILL (NEEDLE) IMPLANT
NDL HYPO 21X1.5 SAFETY (NEEDLE) IMPLANT
NDL HYPO 25X1 1.5 SAFETY (NEEDLE) IMPLANT
NEEDLE HYPO 18GX1.5 BLUNT FILL (NEEDLE) IMPLANT
NEEDLE HYPO 21X1.5 SAFETY (NEEDLE) IMPLANT
NEEDLE HYPO 25X1 1.5 SAFETY (NEEDLE) IMPLANT
NS IRRIG 1000ML POUR BTL (IV SOLUTION) ×3 IMPLANT
PACK FOAM VITOSS 10CC (Orthopedic Implant) ×2 IMPLANT
PACK LAMINECTOMY NEURO (CUSTOM PROCEDURE TRAY) ×3 IMPLANT
PAD ABD 8X10 STRL (GAUZE/BANDAGES/DRESSINGS) IMPLANT
PAD ARMBOARD 7.5X6 YLW CONV (MISCELLANEOUS) ×9 IMPLANT
PATTIES SURGICAL .5 X1 (DISPOSABLE) ×3 IMPLANT
PATTIES SURGICAL .5 X3 (DISPOSABLE) IMPLANT
ROD 40MM SPINAL (Rod) ×2 IMPLANT
ROD CREO 45MM SPINAL (Rod) ×2 IMPLANT
SCREW 6.5X45 (Screw) ×4 IMPLANT
SCREW POLY THRD CREO 6.5X40 (Screw) ×4 IMPLANT
SPACER RISE 8X22 11-17MM-15 (Spacer) ×2 IMPLANT
SPONGE LAP 4X18 X RAY DECT (DISPOSABLE) IMPLANT
SPONGE NEURO XRAY DETECT 1X3 (DISPOSABLE) IMPLANT
SPONGE SURGIFOAM ABS GEL 100 (HEMOSTASIS) ×3 IMPLANT
STRIP CLOSURE SKIN 1/2X4 (GAUZE/BANDAGES/DRESSINGS) ×2 IMPLANT
SUT VIC AB 1 CT1 18XBRD ANBCTR (SUTURE) ×2 IMPLANT
SUT VIC AB 1 CT1 8-18 (SUTURE) ×6
SUT VIC AB 2-0 CP2 18 (SUTURE) ×3 IMPLANT
SUT VIC AB 3-0 SH 8-18 (SUTURE) ×3 IMPLANT
SYR 20CC LL (SYRINGE) IMPLANT
SYR 20ML ECCENTRIC (SYRINGE) ×3 IMPLANT
SYR 5ML LL (SYRINGE) IMPLANT
TAPE CLOTH SURG 4X10 WHT LF (GAUZE/BANDAGES/DRESSINGS) ×2 IMPLANT
TOWEL OR 17X24 6PK STRL BLUE (TOWEL DISPOSABLE) ×3 IMPLANT
TOWEL OR 17X26 10 PK STRL BLUE (TOWEL DISPOSABLE) ×3 IMPLANT
TRAY FOLEY CATH 14FRSI W/METER (CATHETERS) ×3 IMPLANT
WATER STERILE IRR 1000ML POUR (IV SOLUTION) ×3 IMPLANT

## 2013-09-29 NOTE — Progress Notes (Signed)
This nurse and Herma Ard wasted of Fentanyl in the 4NB pyxis sharps box.

## 2013-09-29 NOTE — Anesthesia Postprocedure Evaluation (Signed)
Anesthesia Post Note  Patient: Ashley Reilly  Procedure(s) Performed: Procedure(s) (LRB): TRANSFORAMINAL LUMBAR FOUR-FIVE INTERBODY AND FUSION 1 LEVEL (N/A)  Anesthesia type: general  Patient location: PACU  Post pain: Pain level controlled  Post assessment: Patient's Cardiovascular Status Stable  Last Vitals:  Filed Vitals:   09/29/13 1628  BP:   Pulse: 61  Temp:   Resp: 12    Post vital signs: Reviewed and stable  Level of consciousness: sedated  Complications: No apparent anesthesia complications

## 2013-09-29 NOTE — Transfer of Care (Signed)
Immediate Anesthesia Transfer of Care Note  Patient: Ashley Reilly  Procedure(s) Performed: Procedure(s) with comments: TRANSFORAMINAL LUMBAR FOUR-FIVE INTERBODY AND FUSION 1 LEVEL (N/A) - L4-5 Posterior lumbar interbody fusion  Patient Location: PACU  Anesthesia Type:General  Level of Consciousness: awake, alert , oriented and patient cooperative  Airway & Oxygen Therapy: Patient Spontanous Breathing and Patient connected to face mask oxygen  Post-op Assessment: Report given to PACU RN and Post -op Vital signs reviewed and stable  Post vital signs: Reviewed  Complications: No apparent anesthesia complications

## 2013-09-29 NOTE — Anesthesia Procedure Notes (Signed)
Procedure Name: Intubation Date/Time: 09/29/2013 12:44 PM Performed by: Lovie Chol Pre-anesthesia Checklist: Patient identified, Emergency Drugs available, Suction available, Patient being monitored and Timeout performed Patient Re-evaluated:Patient Re-evaluated prior to inductionOxygen Delivery Method: Circle system utilized Preoxygenation: Pre-oxygenation with 100% oxygen Intubation Type: IV induction Ventilation: Mask ventilation without difficulty Laryngoscope Size: Miller and 2 Grade View: Grade I Tube type: Oral Tube size: 7.0 mm Number of attempts: 1 Airway Equipment and Method: Stylet Placement Confirmation: ETT inserted through vocal cords under direct vision,  CO2 detector and breath sounds checked- equal and bilateral Secured at: 21 cm Tube secured with: Tape Dental Injury: Teeth and Oropharynx as per pre-operative assessment  Comments: Soft bite block utilized

## 2013-09-29 NOTE — Anesthesia Preprocedure Evaluation (Signed)
Anesthesia Evaluation    Reviewed: Allergy & Precautions, H&P , NPO status , Patient's Chart, lab work & pertinent test results  History of Anesthesia Complications Negative for: history of anesthetic complications  Airway       Dental   Pulmonary Current Smoker,          Cardiovascular hypertension,     Neuro/Psych PSYCHIATRIC DISORDERS Depression    GI/Hepatic Neg liver ROS, GERD-  Medicated,  Endo/Other  diabetesHyperthyroidism Morbid obesity  Renal/GU negative Renal ROS     Musculoskeletal  (+) Arthritis -,   Abdominal   Peds  Hematology   Anesthesia Other Findings   Reproductive/Obstetrics                           Anesthesia Physical Anesthesia Plan  ASA: II  Anesthesia Plan: General   Post-op Pain Management:    Induction: Intravenous  Airway Management Planned: Oral ETT  Additional Equipment:   Intra-op Plan:   Post-operative Plan: Extubation in OR  Informed Consent:   Plan Discussed with: CRNA, Anesthesiologist and Surgeon  Anesthesia Plan Comments:         Anesthesia Quick Evaluation

## 2013-09-29 NOTE — Progress Notes (Signed)
Pt was admitted to room 4N11 from [facility]. Admission vital is stable . Pt is comfortable resting with family

## 2013-09-29 NOTE — H&P (Signed)
Ashley Stakes, MD Physician Incomplete  H&P Service date: 09/28/2013 3:22 PM  Related encounter: Pre-op/Pre-procedure Orders from 09/28/2013 in [facility]  Ashley Reilly is an 48 y.o. female.    Chief Complaint:lumbar pain HPI: patient who in the past had lumbar discectomy with some relief of the pain but lately the pain is getting worse  With radiation to the right leg. She has failed with conservative treatment.    Past Medical History   Diagnosis  Date   .  Hypertension     .  Neuromuscular disorder         rt leg tingling/numbness   .  GERD (gastroesophageal reflux disease)     .  High cholesterol     .  Type II diabetes mellitus     .  Daily headache     .  Migraines         "couple times a month here lately" (05/25/2012)   .  Chronic lower back pain         "my disc is messed up" (05/25/2012)   .  DVT (deep venous thrombosis)         right leg, was on Xarelto for 7 months   .  Pneumonia     .  Hyperthyroidism     .  Depression     .  Arthritis         degenerative disc disease, right hip   .  Heart murmur         reports anesthesiologist at [facility] told her she has a "little" murmur       Past Surgical History   Procedure  Laterality  Date   .  Incontinence surgery    ~ 2004       "benign tumors removed; sling placed" (05/25/2012)   .  Carpal tunnel release  Left  ~ 2002   .  Cholecystectomy    ~ 2000   .  Tubal ligation    1990   .  Collagen injection           "put into the walls of bladder cause my sling is failing" (05/25/2012)   .  Lumbar disc surgery    05/25/2012       "L5" (05/25/2012)   .  Lumbar laminectomy/decompression microdiscectomy  Right  05/25/2012       Procedure: Right Lumbar four-five Microdiskectomy ;  Surgeon: Ashley Stakes, MD;  Location: [facility];  Service: Neurosurgery;  Laterality: Right;  Right Lumbar four-five Microdiskectomy   .  Incontinence surgery    2015   .  Abdominal hysterectomy           Family History   Problem   Relation  Age of Onset   .  Melanoma  Mother      Social History: reports that she has been smoking Cigarettes.  She has a 68 pack-year smoking history. She has never used smokeless tobacco. She reports that she does not drink alcohol or use illicit drugs.   Allergies:   Allergies   Allergen  Reactions   .  Tylenol [Acetaminophen]         Sick to stomach, throws up      (Not in a hospital admission)    Results for orders placed during the hospital encounter of 09/27/13 (from the past 48 hour(s))   SURGICAL PCR SCREEN     Status: None     Collection Time  09/27/13  2:53 PM       Result  Value  Ref Range     MRSA, PCR  NEGATIVE   NEGATIVE     Staphylococcus aureus  NEGATIVE   NEGATIVE     Comment:                The Xpert SA Assay (FDA        approved for NASAL specimens        in patients over 61 years of age),        is one component of        a comprehensive surveillance        program.  Test performance has        been validated by [employer] for patients greater        than or equal to 23 year old.        It is not intended        to diagnose infection nor to        guide or monitor treatment.   BASIC METABOLIC PANEL     Status: Abnormal     Collection Time      09/27/13  3:00 PM       Result  Value  Ref Range     Sodium  139   137 - 147 mEq/L     Potassium  4.0   3.7 - 5.3 mEq/L     Chloride  103   96 - 112 mEq/L     CO2  23   19 - 32 mEq/L     Glucose, Bld  113 (*)  70 - 99 mg/dL     BUN  12   6 - 23 mg/dL     Creatinine, Ser  0.94   0.50 - 1.10 mg/dL     Calcium  9.9   8.4 - 10.5 mg/dL     GFR calc non Af Amer  71 (*)  >90 mL/min     GFR calc Af Amer  82 (*)  >90 mL/min     Comment:  (NOTE)        The eGFR has been calculated using the CKD EPI equation.        This calculation has not been validated in all clinical situations.        eGFR's persistently <90 mL/min signify possible Chronic Kidney        Disease.     Anion gap  13   5 - 15   CBC      Status: Abnormal     Collection Time      09/27/13  3:00 PM       Result  Value  Ref Range     WBC  9.7   4.0 - 10.5 K/uL     RBC  4.93   3.87 - 5.11 MIL/uL     Hemoglobin  11.8 (*)  12.0 - 15.0 g/dL     HCT  37.8   36.0 - 46.0 %     MCV  76.7 (*)  78.0 - 100.0 fL     MCH  23.9 (*)  26.0 - 34.0 pg     MCHC  31.2   30.0 - 36.0 g/dL     RDW  17.5 (*)  11.5 - 15.5 %     Platelets  328   150 - 400 K/uL   TYPE AND SCREEN  Status: None     Collection Time      09/27/13  3:04 PM       Result  Value  Ref Range     ABO/RH(D)  A NEG        Antibody Screen  NEG        Sample Expiration  10/11/2013      ABO/RH     Status: None     Collection Time      09/27/13  3:04 PM       Result  Value  Ref Range     ABO/RH(D)  A NEG       No results found.   Review of Systems  Constitutional: Negative.   HENT: Negative.   Respiratory: Negative.   Cardiovascular: Negative.   Gastrointestinal: Negative.   Genitourinary: Negative.   Skin: Negative.   Neurological: Positive for sensory change and focal weakness.  Endo/Heme/Allergies: Negative.   Psychiatric/Behavioral: Negative.     Last menstrual period 05/19/2012. Physical Exam hent, nl. Neck, nl. Cv, nl. Lungs, clear. Abdomen, soft extremities, nl NE[facility]  Weakness od df in the right foot. SLR positive at 30 degrees on the right . Sensory, numbness l4 dermatome. Lumbar myelogram shows spondylolisthesis at l4-5 with osteophyte compromising the l4 nerve root   Assessment/Plan Decompression and fusion at l4-5. She and her husband are aware of risks and benefits   Kadi Hession M 09/28/2013, 3:22 PM

## 2013-09-29 NOTE — Progress Notes (Signed)
Patient refusing to leave oxygen on and says that PCA is not working. Physician called, new orders given, and PCA discontinued. Will continue to monitor. Ashley Reilly, [place]

## 2013-09-30 ENCOUNTER — Encounter (HOSPITAL_COMMUNITY): Payer: Self-pay | Admitting: *Deleted

## 2013-09-30 LAB — GLUCOSE, CAPILLARY
Glucose-Capillary: 102 mg/dL — ABNORMAL HIGH (ref 70–99)
Glucose-Capillary: 134 mg/dL — ABNORMAL HIGH (ref 70–99)

## 2013-09-30 MED ORDER — CALCIUM CARBONATE ANTACID 500 MG PO CHEW
1.0000 | CHEWABLE_TABLET | Freq: Four times a day (QID) | ORAL | Status: DC | PRN
  Administered 2013-09-30: 200 mg via ORAL
  Filled 2013-09-30: qty 1

## 2013-09-30 MED ORDER — PANTOPRAZOLE SODIUM 40 MG PO TBEC
40.0000 mg | DELAYED_RELEASE_TABLET | Freq: Two times a day (BID) | ORAL | Status: DC
  Administered 2013-09-30 – 2013-10-01 (×2): 40 mg via ORAL
  Filled 2013-09-30 (×2): qty 1

## 2013-09-30 MED ORDER — OXYCODONE HCL 5 MG PO TABS
5.0000 mg | ORAL_TABLET | ORAL | Status: DC | PRN
  Administered 2013-09-30 – 2013-10-01 (×3): 10 mg via ORAL
  Filled 2013-09-30 (×3): qty 2

## 2013-09-30 NOTE — Clinical Social Work Note (Signed)
Clinical Social Worker received referral for possible ST-SNF placement.  Chart reviewed.  PT/OT recommending home with home health.  Spoke with RN Case Manager who will follow up with patient to discuss home health needs.    CSW signing off - please re consult if social work needs arise.  Jesse Levone Otten, LCSW [phone] 

## 2013-09-30 NOTE — Op Note (Signed)
NAMEMADELINE, PHO                 ACCOUNT NO.:  [id number]  MEDICAL RECORD NO.:  [id number]  LOCATION:  4N11C                        FACILITY:  [facility]  PHYSICIAN:  Hilda Lias, M.D.   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  PROCEDURES:  Right L4-5 diskectomy.  Facetectomy.  Lysis of adhesion to decompress the L4, L5, as well as the thecal sac.  Interbody fusion in the right side.  Pedicle screws bilaterally at L4-L5.  Posterolateral arthrodesis with Vitoss, BMP and autograft.  Cell Saver.  C-arm.  SURGEON:  Hilda Lias, M.D.  ASSISTANT:  Stefani Dama, M.D.  CLINICAL HISTORY:  The patient in the past underwent L4-5 extraforaminal diskectomy.  The patient did well, but lately, she had been complaining of more burning sensation with weakness in the right side.  She has failed with conservative treatment.  She had been seen by different clinic.  After multiple attempts with medication and epidural injection, she is not any better.  Myelogram showed a stable 2-3 and 4-5 with quite a bit of facet arthropathy bilaterally right worse than the right side with a scar tissue in the previous surgical area.  Surgery was advised. The patient knew the risk and the benefit with the surgery.  DESCRIPTION OF PROCEDURE:  The patient was taken to the OR, and after intubation, she was positioned in a prone manner.  The back was cleaned first with Betadine and then with DuraPrep.  Once the solution was dry, we applied a drape and midline incision from L4-L5 was made with retraction of the muscle all the way laterally until we were  able to see the facet at L4-5.  We proceed with removal of the spinous process of L4 after we identified the area with x-ray.  We removed the lamina bilaterally.  In the right side, we did facetectomy with lysis of adhesion to decompress only the thecal sac, but also the L4 and L5 nerve root.  At the end, we had plenty of space.  In the right side, we opened the disk space, and medial and lateral diskectomy was done with removal of endplate.  Cage was inserted with the height of 11, which was broad all the way with expansion to 17.  There was autograft and BMP in the cage and the rest of the disk space was filled up with the same material.  In the the left side, we saved the facet and using the C-arm first in AP view and then a lateral view, we made four holes in the pedicles.  Prior to insertion of the screws, we feel all the four quadrants just to be sure that we were surrounded by bone.  Once this was accomplished, two screws of 6.5 x 45 were inserted in the left side and in the right side, there were 6.5 x 40.  They were connected with the rod  and kept in place with Capps.  Then, interlink from right to left was done.  From then on using the drill, we removed the periosteum of the lateral aspect of the facet of 4-5 bilaterally and a mix of BMP and autograft from Vitoss was used for arthrodesis.  A drain was left in the epidural space and the wound was closed with multiple layers of Vicryl and staples.  The patient did well and went to the recovery room.          ______________________________ Hilda Lias, M.D.     EB/MEDQ  D:  09/29/2013  T:  09/30/2013  Job:  [id number]

## 2013-09-30 NOTE — Progress Notes (Signed)
Subjective: Patient reports doing well significant improvement in leg pain  Objective: Vital signs in last 24 hours: Temp:  [97 F (36.1 C)-98.4 F (36.9 C)] 98.3 F (36.8 C) (09/26 0232) Pulse Rate:  [58-81] 63 (09/26 0625) Resp:  [11-20] 18 (09/26 0625) BP: (93-129)/(40-67) 99/40 mmHg (09/26 0625) SpO2:  [97 %-100 %] 97 % (09/26 0625) FiO2 (%):  [100 %] 100 % (09/25 2000) Weight:  [83.144 kg (183 lb 4.8 oz)] 83.144 kg (183 lb 4.8 oz) (09/25 1810)  Intake/Output from previous day: 09/25 0701 - 09/26 0700 In: 1650 [I.V.:1400; IV Piggyback:250] Out: 2175 [Urine:1825; Drains:150; Blood:200] Intake/Output this shift:    nneurologically stable wound clean dry and intact  Lab Results:  Recent Labs  09/27/13 1500  WBC 9.7  HGB 11.8*  HCT 37.8  PLT 328   BMET  Recent Labs  09/27/13 1500  NA 139  K 4.0  CL 103  CO2 23  GLUCOSE 113*  BUN 12  CREATININE 0.94  CALCIUM 9.9    Studies/Results: Dg Lumbar Spine 2-3 Views  09/29/2013   CLINICAL DATA:  Posterior fusion of L4-5.  EXAM: DG C-ARM 61-120 MIN; LUMBAR SPINE - 2-3 VIEW  COMPARISON:  Same day.  FINDINGS: Two intraoperative fluoroscopic images of the lumbar spine were submitted for review. These images demonstrate the patient to be status post posterior fusion of L4-5 with bilateral intrapedicular screw placement and interbody spacer. Good alignment of vertebral bodies is noted.  IMPRESSION: Status post posterior fusion of L4-5.   Electronically Signed   By: Roque Lias M.D.   On: 09/29/2013 15:05   Dg Lumbar Spine 1 View  09/29/2013   CLINICAL DATA:  Intraoperative fusion  EXAM: LUMBAR SPINE - 1 VIEW  COMPARISON:  04/14/2013  FINDINGS: Surgical retractors and instruments are noted at the L4-5 interspace. The numbering nomenclature utilized is similar to that utilized on prior CT examination.  IMPRESSION: Intraoperative localization at L4-5.   Electronically Signed   By: Alcide Clever M.D.   On: 09/29/2013 13:54   Dg  C-arm 1-60 Min  09/29/2013   CLINICAL DATA:  Posterior fusion of L4-5.  EXAM: DG C-ARM 61-120 MIN; LUMBAR SPINE - 2-3 VIEW  COMPARISON:  Same day.  FINDINGS: Two intraoperative fluoroscopic images of the lumbar spine were submitted for review. These images demonstrate the patient to be status post posterior fusion of L4-5 with bilateral intrapedicular screw placement and interbody spacer. Good alignment of vertebral bodies is noted.  IMPRESSION: Status post posterior fusion of L4-5.   Electronically Signed   By: Roque Lias M.D.   On: 09/29/2013 15:05    Assessment/Plan: Postop day 1 from a lumbar fusion.  Progressive mobilization with physical and occupational therapy Foley is out   LOS: 1 day     Andraya Frigon P 09/30/2013, 8:46 AM

## 2013-09-30 NOTE — Evaluation (Signed)
Occupational Therapy Evaluation Patient Details Name: Ashley Reilly MRN: [id number] DOB: [birth year] Today's Date: 09/30/2013    History of Present Illness 48 y.o. s/p TRANSFORAMINAL LUMBAR FOUR-FIVE INTERBODY AND FUSION 1 LEVEL .   Clinical Impression   Pt s/p above. Pt independent with ADLs, PTA. Feel pt will benefit from acute OT to reinforce back precautions and increase independence prior to d/c.     Follow Up Recommendations  No OT follow up;Supervision - Intermittent    Equipment Recommendations  Other (comment) (AE)    Recommendations for Other Services       Precautions / Restrictions Precautions Precautions: Back Precaution Booklet Issued: Yes (comment) Precaution Comments: Educated on back precautions Required Braces or Orthoses: Spinal Brace Spinal Brace: Lumbar corset;Applied in sitting position Restrictions Weight Bearing Restrictions: No      Mobility Bed Mobility Overal bed mobility: Needs Assistance Bed Mobility: Rolling;Sidelying to Sit Rolling: Supervision Sidelying to sit: Supervision       General bed mobility comments: cues for log roll technique.  Transfers Overall transfer level: Needs assistance   Transfers: Sit to/from Stand Sit to Stand: Supervision              Balance Overall balance assessment: No apparent balance deficits (not formally assessed)                                          ADL Overall ADL's : Needs assistance/impaired     Grooming: Oral care;Set up;Supervision/safety;Standing           Upper Body Dressing : Set up;Sitting;Supervision/safety   Lower Body Dressing: With adaptive equipment;Sit to/from stand;Min guard   Toilet Transfer: Supervision/safety;Ambulation (chair)           Functional mobility during ADLs: Supervision/safety General ADL Comments: Discussed positioning of pillows. Educated on use of cup for teeth care and placement of grooming items to avoid breaking  precauitons. Educated on AE for LB ADLs and pt practiced. Educated on what pt could use for toilet aide. Educated on safety tips-safe shoewear, sitting for LB ADLs, rugs, recommended spouse be with her for shower transfer. Pt performed steps at supervision level with OT.     Vision                     Perception     Praxis      Pertinent Vitals/Pain Pain Assessment: 0-10 Pain Score: 7  Pain Location: right leg Pain Intervention(s): Monitored during session     Hand Dominance Left   Extremity/Trunk Assessment Upper Extremity Assessment Upper Extremity Assessment: Overall WFL for tasks assessed   Lower Extremity Assessment Lower Extremity Assessment: Overall WFL for tasks assessed (pain in right leg)       Communication Communication Communication: No difficulties   Cognition Arousal/Alertness: Awake/alert Behavior During Therapy: WFL for tasks assessed/performed Overall Cognitive Status: Within Functional Limits for tasks assessed                     General Comments       Exercises       Shoulder Instructions      Home Living Family/patient expects to be discharged to:: Private residence Living Arrangements: Spouse/significant other Available Help at Discharge: Family (states spouse will stay as long as needed) Type of Home: Mobile home Home Access: Stairs to enter [employer] of Steps: 4 Entrance Stairs-Rails:  Right Home Layout: One level     Bathroom Shower/Tub: Tub only;Walk-in shower         Home Equipment: Dan Humphreys - 2 wheels;Bedside commode;Shower seat          Prior Functioning/Environment Level of Independence: Independent             OT Diagnosis: Acute pain   OT Problem List: Decreased knowledge of use of DME or AE;Decreased knowledge of precautions;Pain;Decreased range of motion   OT Treatment/Interventions: Self-care/ADL training;DME and/or AE instruction;Therapeutic activities;Patient/family  education;Balance training    OT Goals(Current goals can be found in the care plan section) Acute Rehab OT Goals Patient Stated Goal: not stated OT Goal Formulation: With patient Time For Goal Achievement: 10/07/13 Potential to Achieve Goals: Good ADL Goals Pt Will Perform Grooming: with modified independence;standing Pt Will Perform Lower Body Dressing: with modified independence;with adaptive equipment;sit to/from stand Pt Will Perform Toileting - Clothing Manipulation and hygiene: with modified independence;sit to/from stand;with adaptive equipment Additional ADL Goal #1: Pt will independently verbalize and demonstrate 3/3 back precautions.  OT Frequency: Min 2X/week   Barriers to D/C:            Co-evaluation              End of Session Equipment Utilized During Treatment: Gait belt;Back brace Nurse Communication: Mobility status  Activity Tolerance: Patient tolerated treatment well Patient left: in chair;with call bell/phone within reach   Time: 0932-0959 OT Time Calculation (min): 27 min Charges:  OT General Charges $OT Visit: 1 Procedure OT Evaluation $Initial OT Evaluation Tier I: 1 Procedure OT Treatments $Self Care/Home Management : 8-22 mins G-CodesEarlie Raveling OTR/L [phone] 09/30/2013, 10:20 AM

## 2013-10-01 LAB — GLUCOSE, CAPILLARY: GLUCOSE-CAPILLARY: 118 mg/dL — AB (ref 70–99)

## 2013-10-01 MED ORDER — OXYCODONE HCL 5 MG PO TABS: 5.0000 mg | ORAL_TABLET | ORAL | Status: AC | PRN

## 2013-10-01 NOTE — Progress Notes (Signed)
PT Cancellation Note  Patient Details Name: Ashley Reilly MRN: [id number] DOB: 1965-01-07   Cancelled Treatment:     Pt screened and no acute PT needs at this time. Spoke with OT, pt is mod I for mobility. Will sign off at this time.    Donnamarie Poag Marley, [place]  [phone] 10/01/2013, 6:46 AM

## 2013-10-01 NOTE — Progress Notes (Signed)
Patient ready for discharge home; discharge instructions given and reviewed; Rx's given; patient wearing her back support brace; discharged home accompanied by her husband.

## 2013-10-01 NOTE — Progress Notes (Signed)
Patient was instructed to walk on the unit independently with family.  Patient is stable with ambulation.  Patient got on the elevator with family and went outside.  This nurse spoke with patient regarding safety concerns while away from the floor.  Patient stated that her nurse said "she could go out for some fresh air since she was claustrophobic" Reinforced need to go back to floor due to safety concerns when away from nursing care. Patient refused to come up the to the floor directly.  Spoke with nurse regarding patient leaving the floor.  Nurse had instructed patient to walk around the floor; not to leave the unit.  Nurse will reinforce need to remain on floor for patient safety. Charge nurse will continue to do same.

## 2013-10-01 NOTE — Discharge Summary (Signed)
Physician Discharge Summary  Patient ID: Ashley Reilly MRN: [id number] DOB/AGE: 07/12/1965 48 y.o.  Admit date: 09/29/2013 Discharge date: 10/01/2013  Admission Diagnoses: L4-5 spondylolisthesis stenosis with radiculopathy  Discharge Diagnoses: L4-5 spondylolisthesis and stenosis with radiculopathy Active Problems:   Spondylolisthesis of lumbar region   Discharged Condition: good  Hospital Course: Patient was admitted to undergo surgical decompression at L4-5 with arthrodesis. She tolerated procedure well. Hemovac drain is removed. Incision is clean and dry. Motor function in lower extremities is intact.  Consults: None  Significant Diagnostic Studies: None  Treatments: surgery: Lumbar laminectomy and decompression of L4 and L5 nerve roots posterior lumbar interbody arthrodesis using peek spacers pedicle screw fixation L4-L5 posterior lateral arthrodesis.  Discharge Exam: Blood pressure 95/53, pulse 63, temperature 98 F (36.7 C), temperature source Axillary, resp. rate 20, height 4' 1.32" (1.253 m), weight 83.144 kg (183 lb 4.8 oz), last menstrual period 05/19/2012, SpO2 90.00%. Incision is clean and dry. Motor function in lower extremities is intact in iliopsoas quadriceps tibialis anterior and gastrocs. Station and gait are intact.  Disposition: 01-Home or Self Care  Discharge Instructions   Call MD for:  redness, tenderness, or signs of infection (pain, swelling, redness, odor or green/yellow discharge around incision site)    Complete by:  As directed      Call MD for:  severe uncontrolled pain    Complete by:  As directed      Call MD for:  temperature >100.4    Complete by:  As directed      Diet - low sodium heart healthy    Complete by:  As directed      Increase activity slowly    Complete by:  As directed             Medication List         cholecalciferol 1000 UNITS tablet  Commonly known as:  VITAMIN D  Take 1,000 Units by mouth daily.     diazepam 5 MG  tablet  Commonly known as:  VALIUM  Take 5 mg by mouth every 6 (six) hours as needed for anxiety. For spasms     gabapentin 600 MG tablet  Commonly known as:  NEURONTIN  Take 600 mg by mouth 3 (three) times daily.     lisinopril 10 MG tablet  Commonly known as:  PRINIVIL,ZESTRIL  Take 10 mg by mouth every morning.     metFORMIN 500 MG 24 hr tablet  Commonly known as:  GLUCOPHAGE-XR  Take 1,000 mg by mouth daily before supper.     methimazole 5 MG tablet  Commonly known as:  TAPAZOLE  Take 15 mg by mouth daily.     omeprazole 20 MG capsule  Commonly known as:  PRILOSEC  Take 20 mg by mouth 2 (two) times daily.     oxyCODONE 5 MG immediate release tablet  Commonly known as:  Oxy IR/ROXICODONE  Take 1-2 tablets (5-10 mg total) by mouth every 4 (four) hours as needed for severe pain.     oxymorphone 10 MG tablet  Commonly known as:  OPANA  Take 10 mg by mouth every 8 (eight) hours as needed for pain.     pravastatin 10 MG tablet  Commonly known as:  PRAVACHOL  Take 10 mg by mouth at bedtime.     sertraline 50 MG tablet  Commonly known as:  ZOLOFT  Take 50 mg by mouth daily.         SignedStefani Dama 10/01/2013,  9:06 AM

## 2013-10-02 NOTE — Progress Notes (Signed)
UR complete.  Aritha Huckeba RN, MSN 

## 2013-10-04 MED FILL — Sodium Chloride IV Soln 0.9%: INTRAVENOUS | Qty: 1000 | Status: AC

## 2013-10-04 MED FILL — Heparin Sodium (Porcine) Inj 1000 Unit/ML: INTRAMUSCULAR | Qty: 30 | Status: AC

## 2014-06-21 IMAGING — CR DG CHEST 2V
2 series · 2 of 2 positions shown · non-contrast
Comparison: None.

CLINICAL DATA: Hypertension

CHEST - 2 VIEW

[view not recorded (1 of 2)]
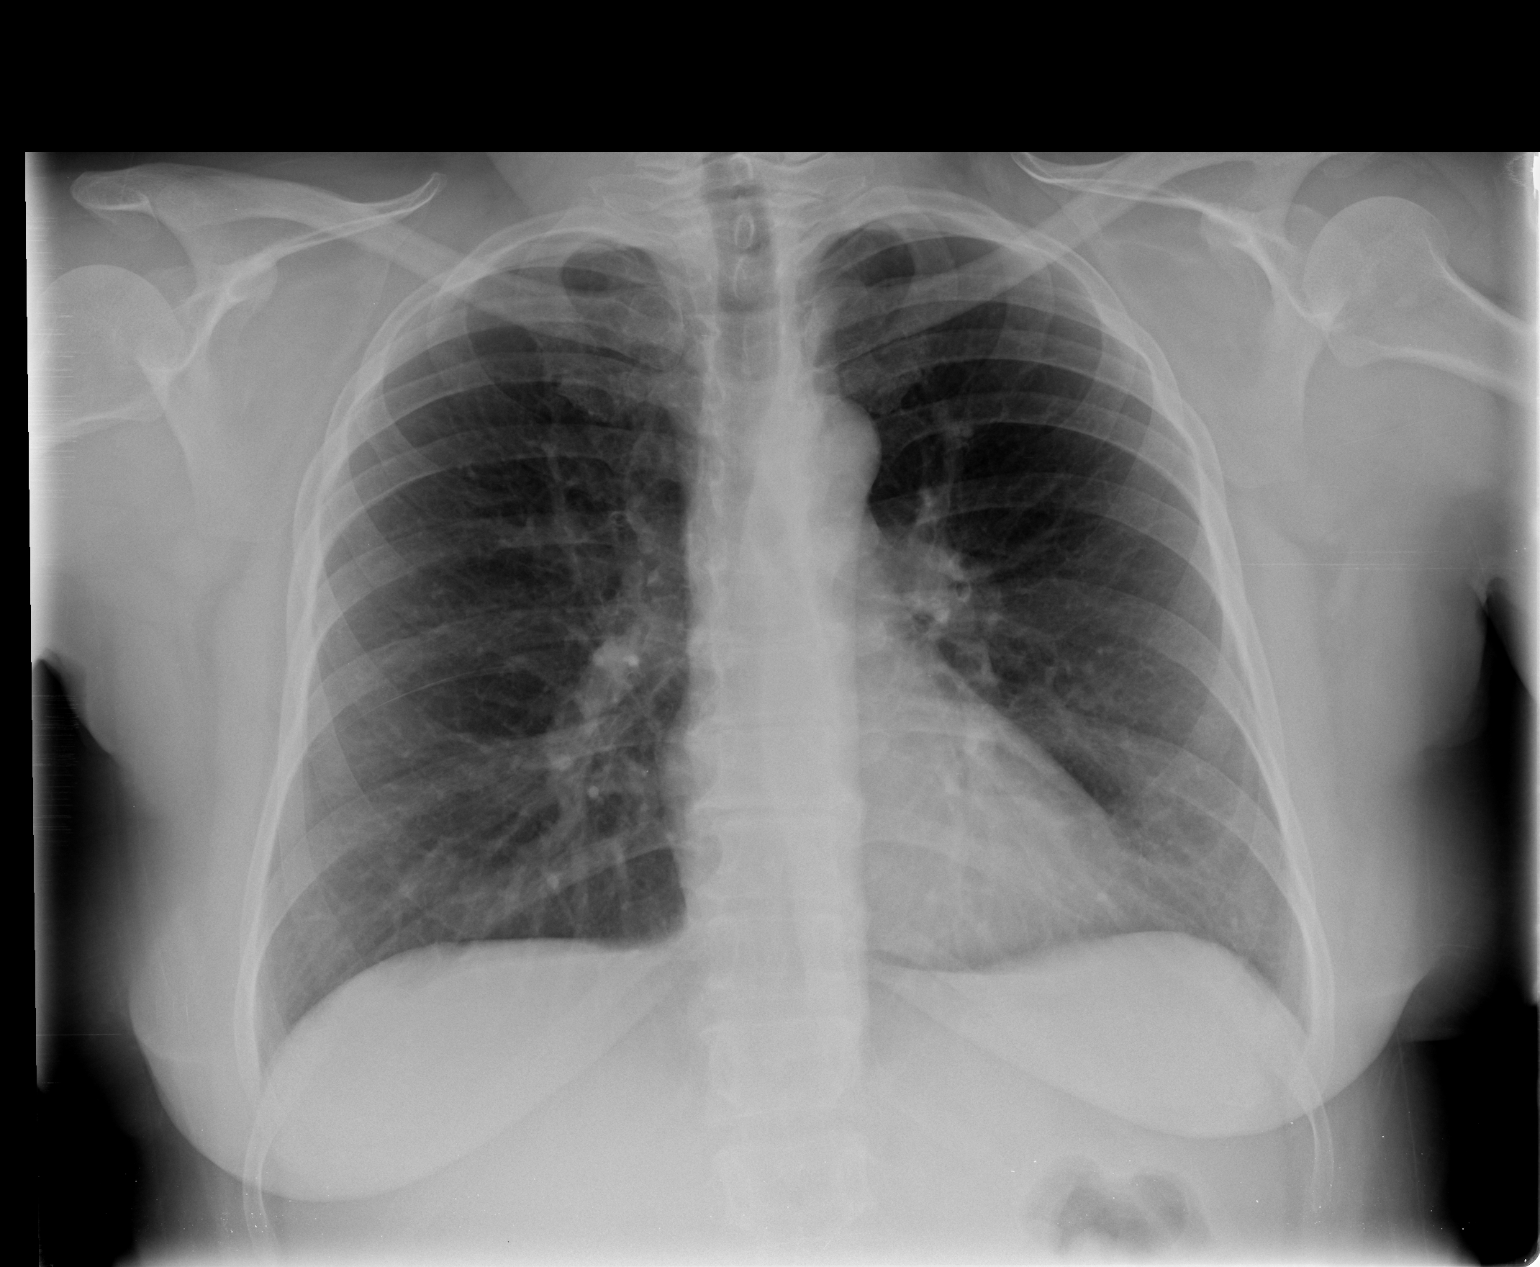

[view not recorded (2 of 2)]
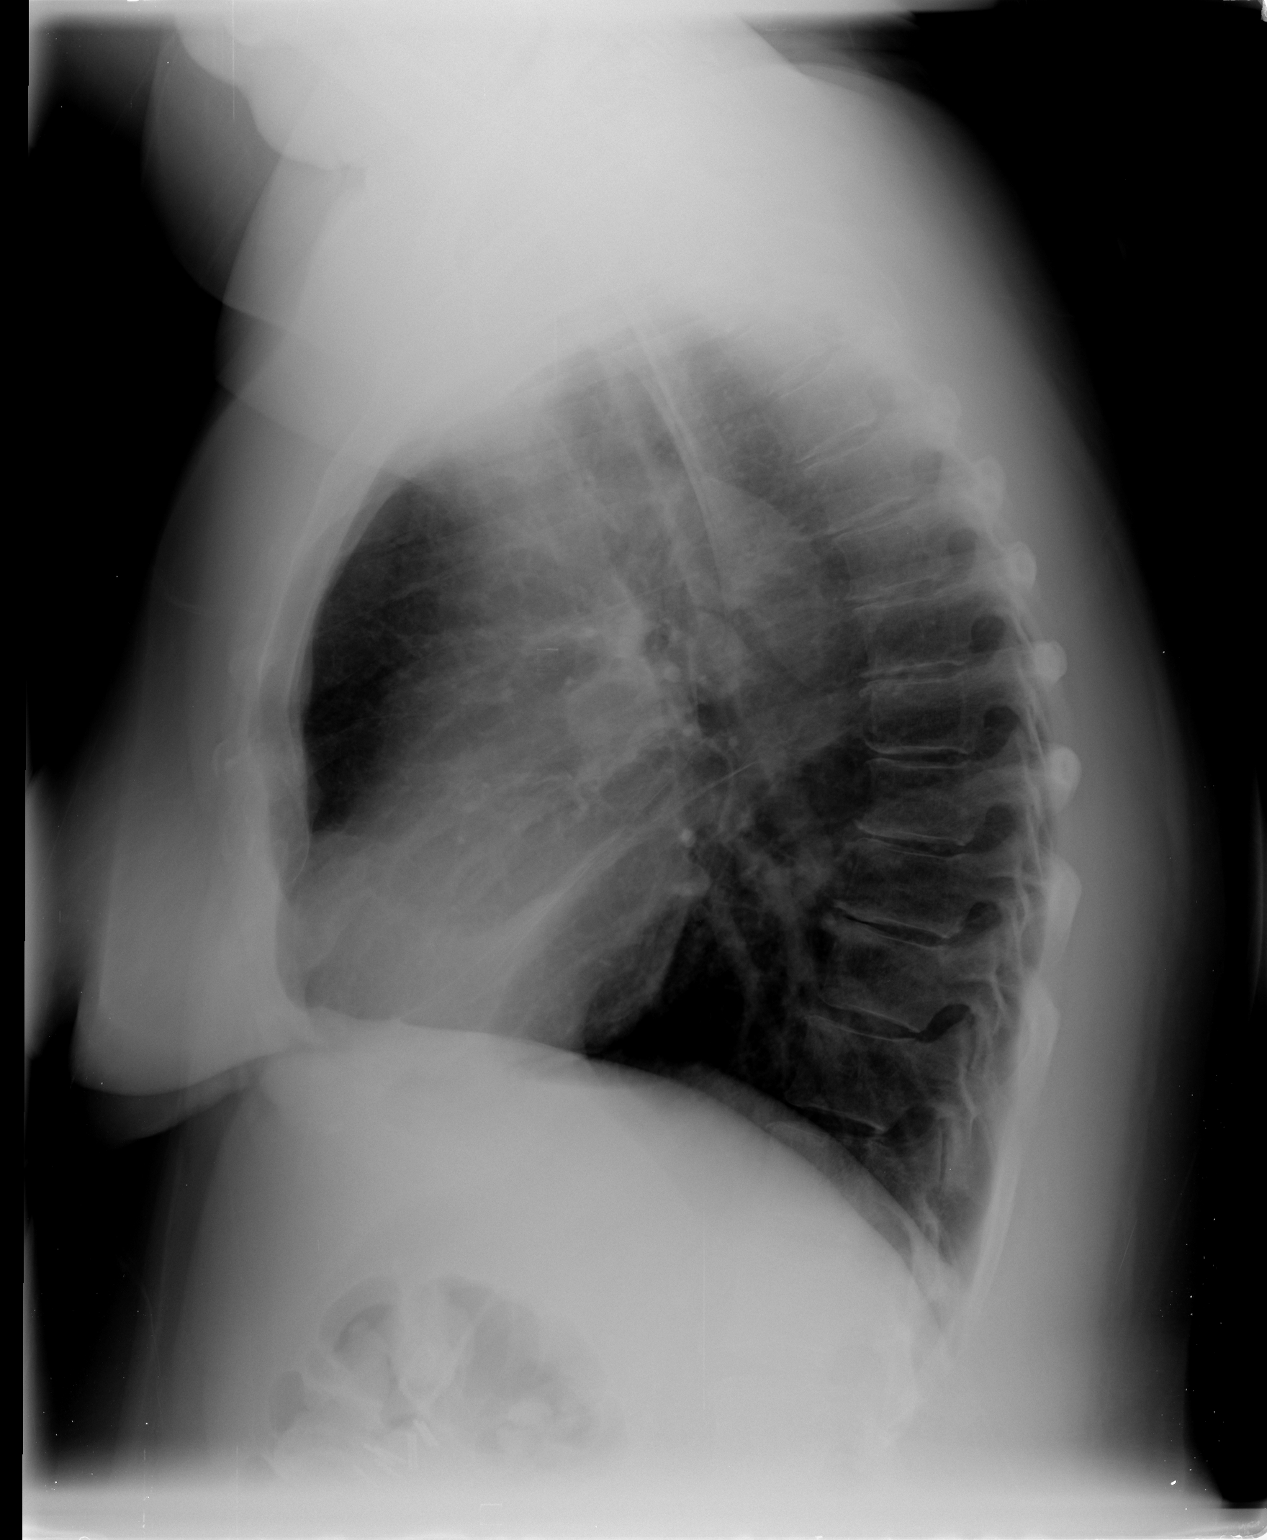

[2 of 2 positions shown; findings below may reference images not displayed]

FINDINGS: Cardiomediastinal silhouette appears normal.  No acute
pulmonary disease is noted.  Bony thorax is intact.
IMPRESSION: No acute cardiopulmonary abnormality seen.

## 2015-05-13 IMAGING — RF DG MYELOGRAPHY LUMBAR INJ LUMBOSACRAL
13 of 23 series · 13 of 23 positions shown · non-contrast
Comparison: MR RESENDIZ SPINE WO/W CM dated 08/30/2012. MRI lumbar spine
01/07/2012.

CLINICAL DATA: Continued low back and right leg pain after right
L4-5 extraforaminal lumbar discectomy
TECHNIQUE: Contiguous axial images were obtained through the Lumbar spine after
the intrathecal infusion of infusion. Coronal and sagittal
reconstructions were obtained of the axial image sets.

[Series 1: (hospital) · 1 of 1 slices shown]
[im 1/1]
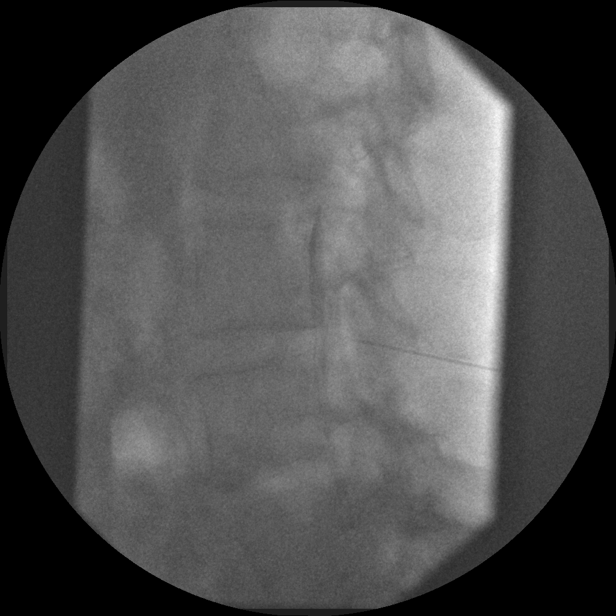

[Series 3: myelogram  white · 1 of 1 slices shown (1 of 10)]
[im 1/1]
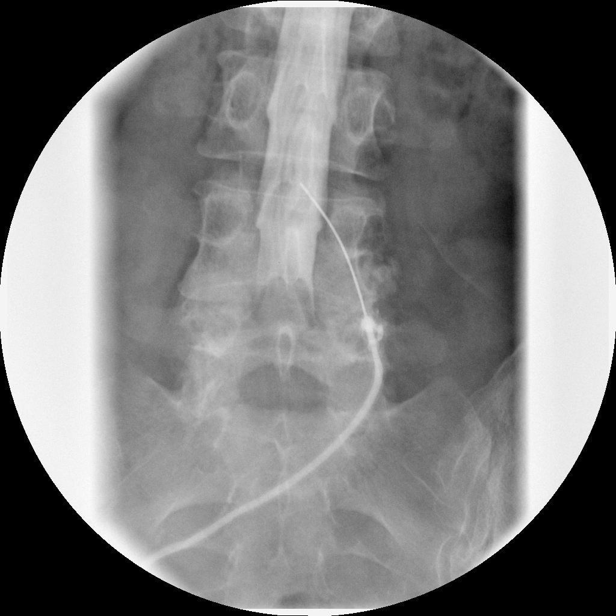

[Series 5: myelogram  white · 1 of 1 slices shown (2 of 10)]
[im 1/1]
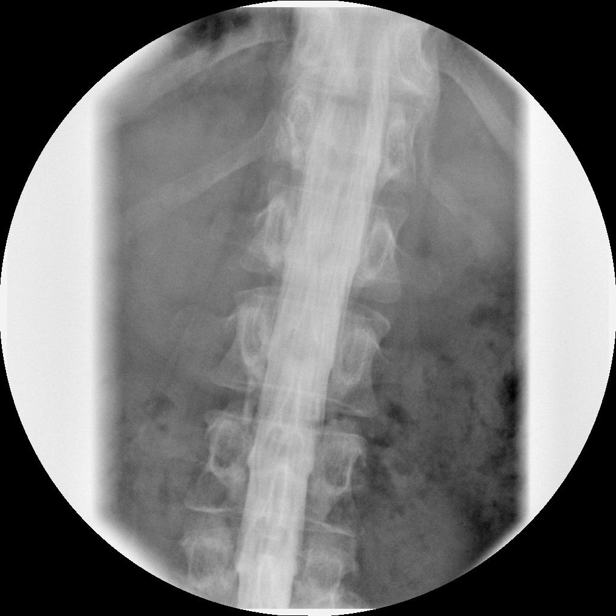

[Series 7: myelogram  white · 1 of 1 slices shown (3 of 10)]
[im 1/1]
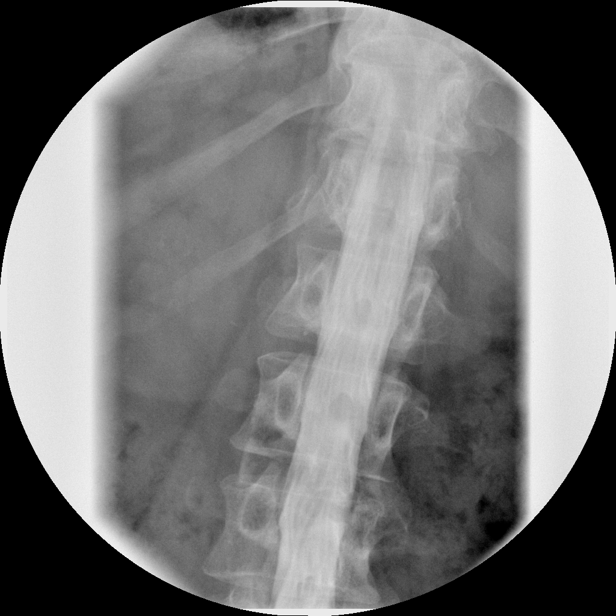

[Series 8: myelogram  white · 1 of 1 slices shown (4 of 10)]
[im 1/1]
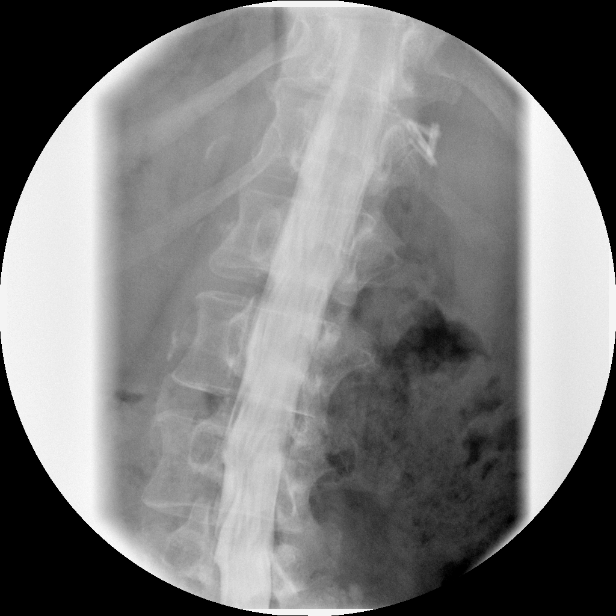

[Series 10: myelogram  white · 1 of 1 slices shown (5 of 10)]
[im 1/1]
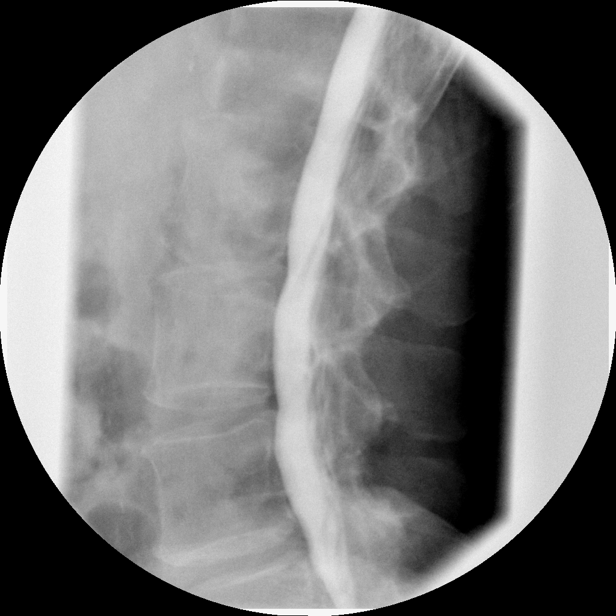

[Series 12: myelogram  white · 1 of 1 slices shown (6 of 10)]
[im 1/1]
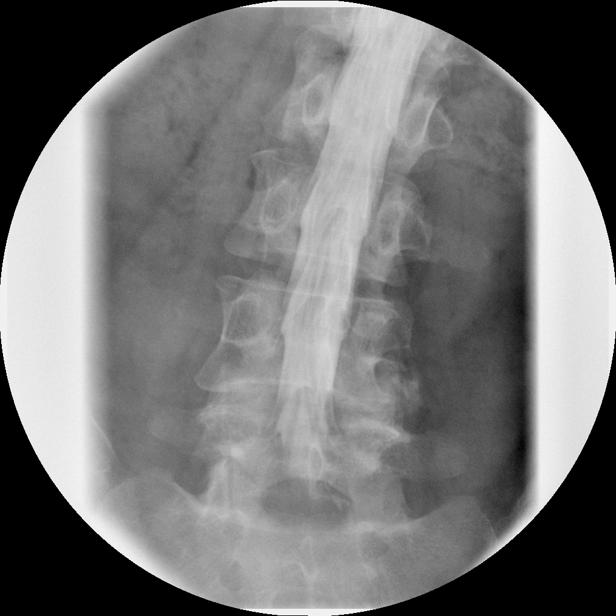

[Series 14: myelogram  white · 1 of 1 slices shown (7 of 10)]
[im 1/1]
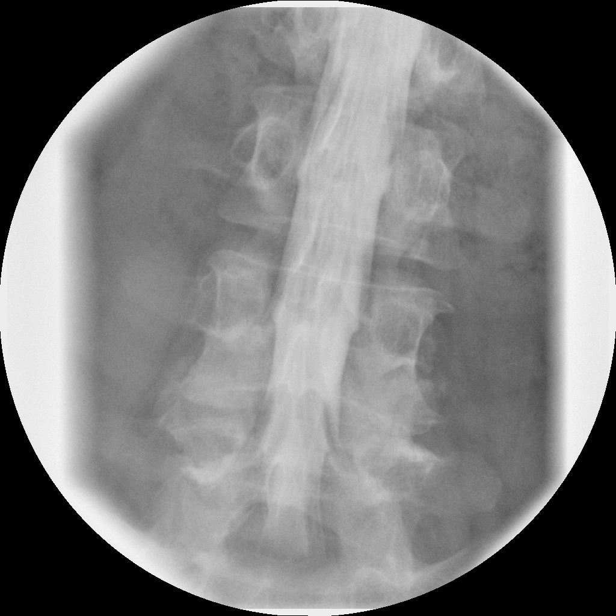

[Series 16: myelogram  white · 1 of 1 slices shown (8 of 10)]
[im 1/1]
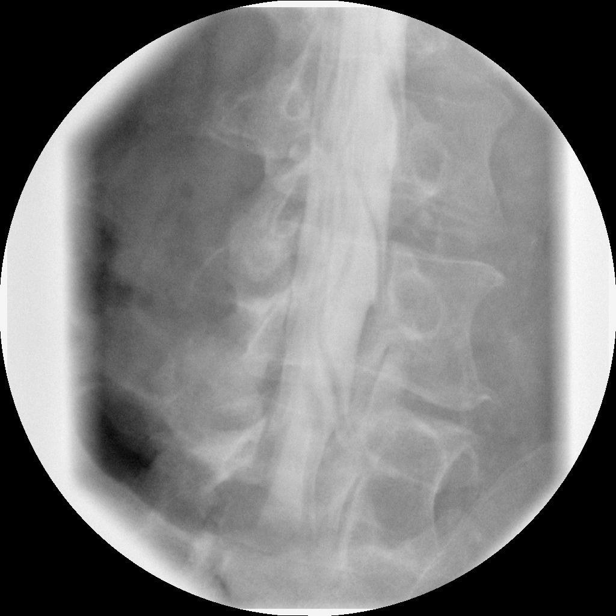

[Series 17: myelogram  white · 1 of 1 slices shown (9 of 10)]
[im 1/1]
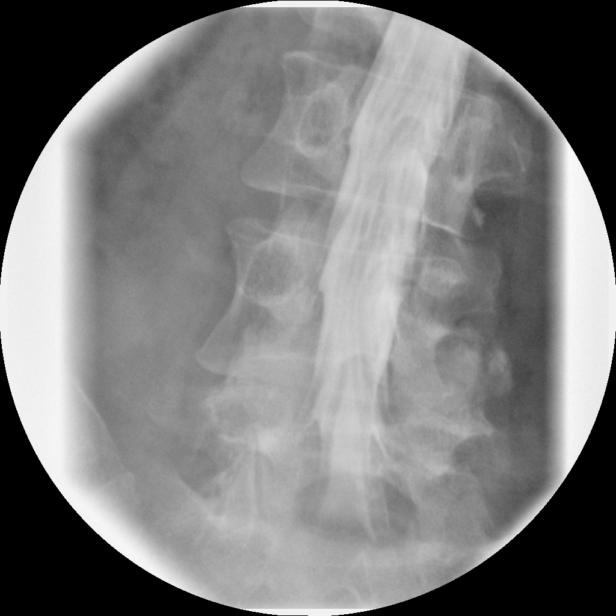

[Series 19: myelogram  white · 1 of 1 slices shown (10 of 10)]
[im 1/1]
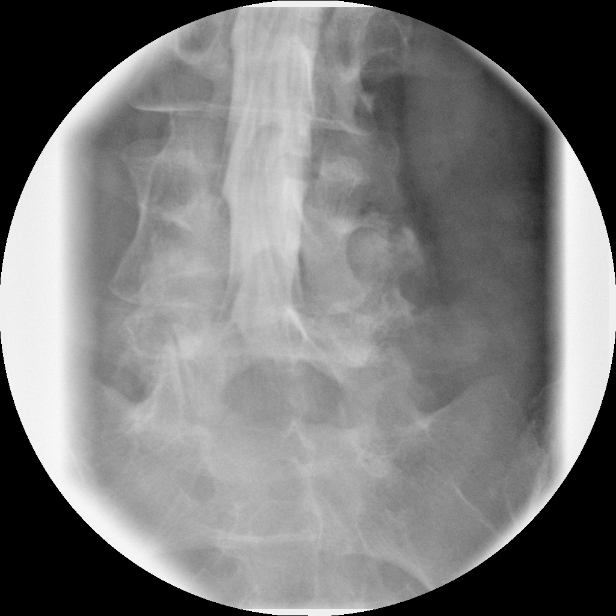

[Series 1002: view not recorded · 0.20mm/px · 1 of 1 slices shown (1 of 2)]
[im 1/1]
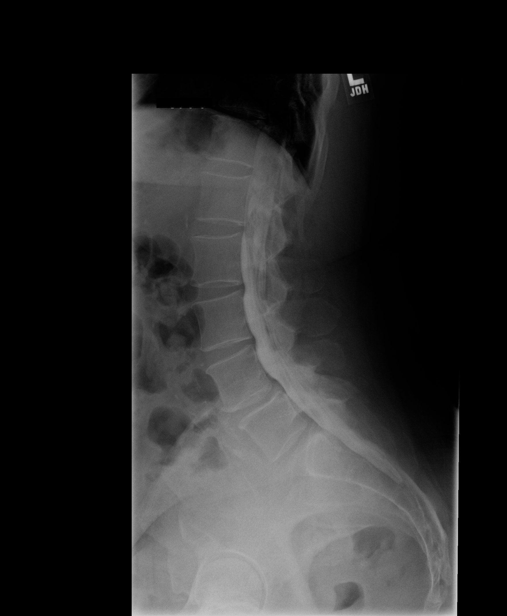

[Series 1004: view not recorded · 0.20mm/px · 1 of 1 slices shown (2 of 2)]
[im 1/1]
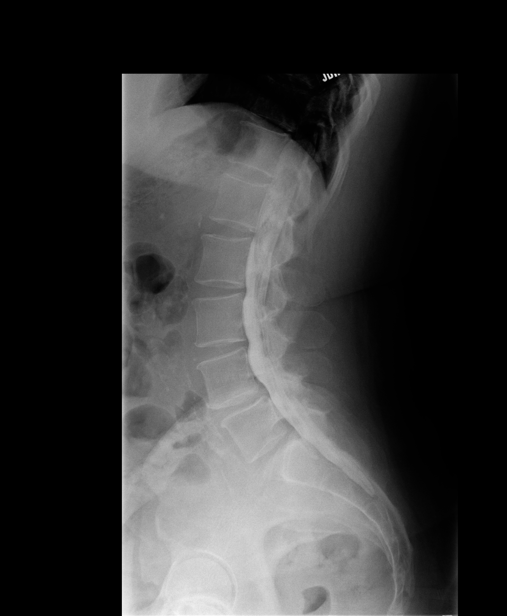

[13 of 23 positions shown; findings below may reference images not displayed]

EXAM:
LUMBAR MYELOGRAM

FLUOROSCOPY TIME:  1 min and 19 seconds.

PROCEDURE:
After thorough discussion of risks and benefits of the procedure
including bleeding, infection, injury to nerves, blood vessels,
adjacent structures as well as headache and CSF leak, written and
oral informed consent was obtained. Consent was obtained by Dr. Pastor
Jempi. Time out form was completed.

Patient was positioned prone on the fluoroscopy table. Local
anesthesia was provided with 1% lidocaine without epinephrine after
prepped and draped in the usual sterile fashion. Puncture was
performed at L[facility]EL using a 3 1/2 inch 22-gauge spinal needle via
right paramedian approach. Using a single pass through the dura, the
needle was placed within the thecal sac, with return of clear CSF.
15 mL of Ymnipaque-D0O was injected into the thecal sac, with normal
opacification of the nerve roots and cauda equina consistent with
free flow within the subarachnoid space.

I personally performed the lumbar puncture and administered the
intrathecal contrast. I also personally supervised acquisition of
the myelogram images.
FINDINGS: LUMBAR MYELOGRAM FINDINGS:

Good opacification lumbar subarachnoid space. No nerve root cut off
or spinal stenosis. Shallow ventral defects at L2-3, L3-4, and L4-5
are noncompressive.

The alignment is anatomic with patient recumbent for myelography.
With patient upright there is 3 mm anterolisthesis in neutral, which
does not significantly change between flexion extension.

Mild vascular calcification. Prior cholecystectomy. No worrisome
osseous lesions

CT LUMBAR MYELOGRAM FINDINGS:

Anterolisthesis reduces with patient recumbent for CT. Normal conus.
Coarsened trabecular markings, possible mild osteopenia. No
pseudomeningocele.

The individual disc spaces were examined with axial images as
follows:

L1-L2:  normal.

L2-L3:  Normal.

L3-L4:  Normal.

L4-L5: Status post right extraforaminal discectomy. Mild facet and
ligamentum flavum hypertrophy. Mild annular bulging. No L5 nerve
root impingement in the canal. Bony overgrowth from the medial and
ventral aspect of the right inferior articular process of L4 and
right superior articular process of L5 narrow the right foramen,
likely compressing the right L4 nerve root (image 14 series 401,
image 22 series 400). Within limits of visualization on CT, no
significant residual extraforaminal protrusion is present although
mild right lateral vertebral osseous spurring is observed.

L5-S1:  Unremarkable.

No pelvic osseous lesions. Mild bilateral SI vacuum joint phenomena.
IMPRESSION: LUMBAR MYELOGRAM IMPRESSION:

Mild dynamic instability. 3 mm anterolisthesis develops with patient
upright.

CT LUMBAR MYELOGRAM IMPRESSION:

Moderate multifactorial foraminal narrowing at L4-5 on the right
related to osseous spurring particularly from the facet and annular
bulging. Right L4 nerve root impingement appears to be present more
proximally than the area of previous extra foraminal surgery.

## 2015-10-28 IMAGING — RF DG LUMBAR SPINE 2-3V
1 series · 2 of 2 positions shown · non-contrast
Comparison: Same day.

CLINICAL DATA: Posterior fusion of L4-5.

EXAM:
DG C-ARM 61-120 MIN; LUMBAR SPINE - 2-3 VIEW

[Series 1: run · 2 of 2 slices shown]
[im 1/2]
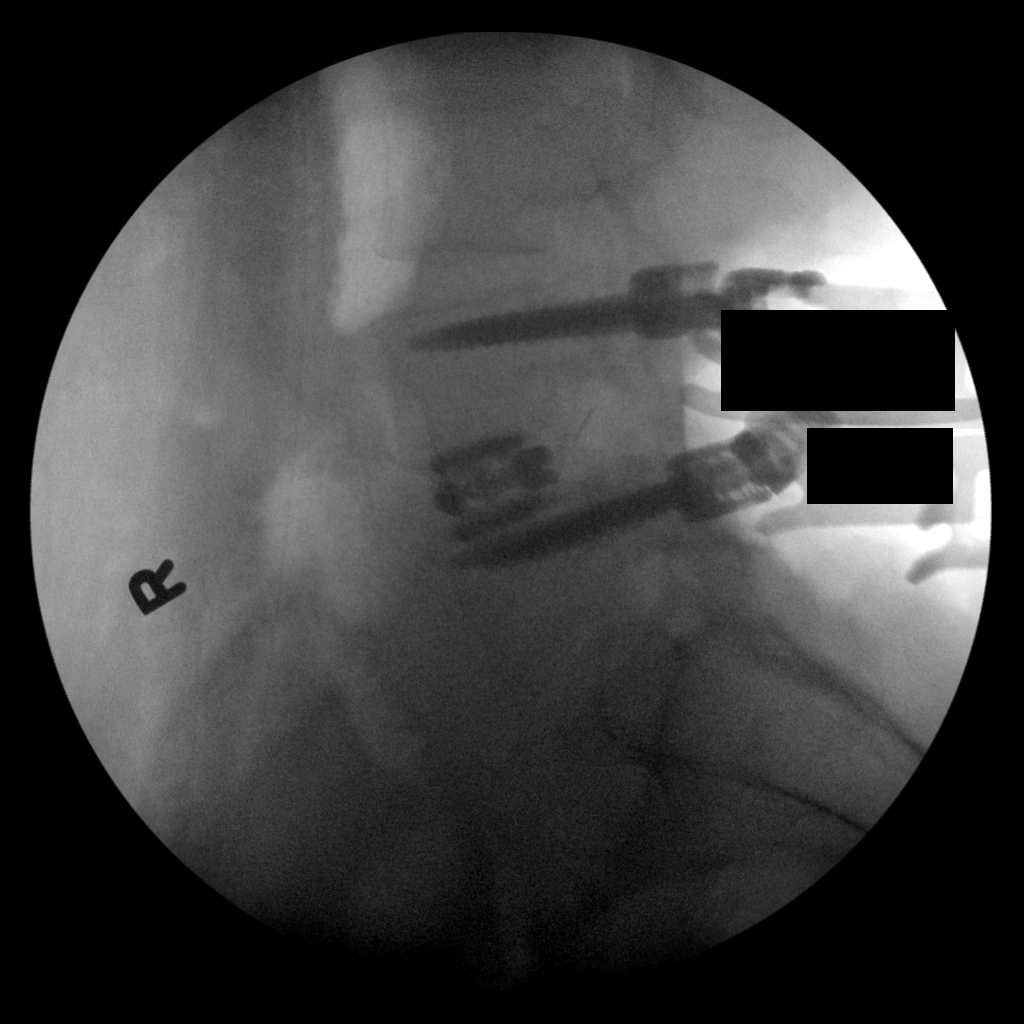
[im 2/2]
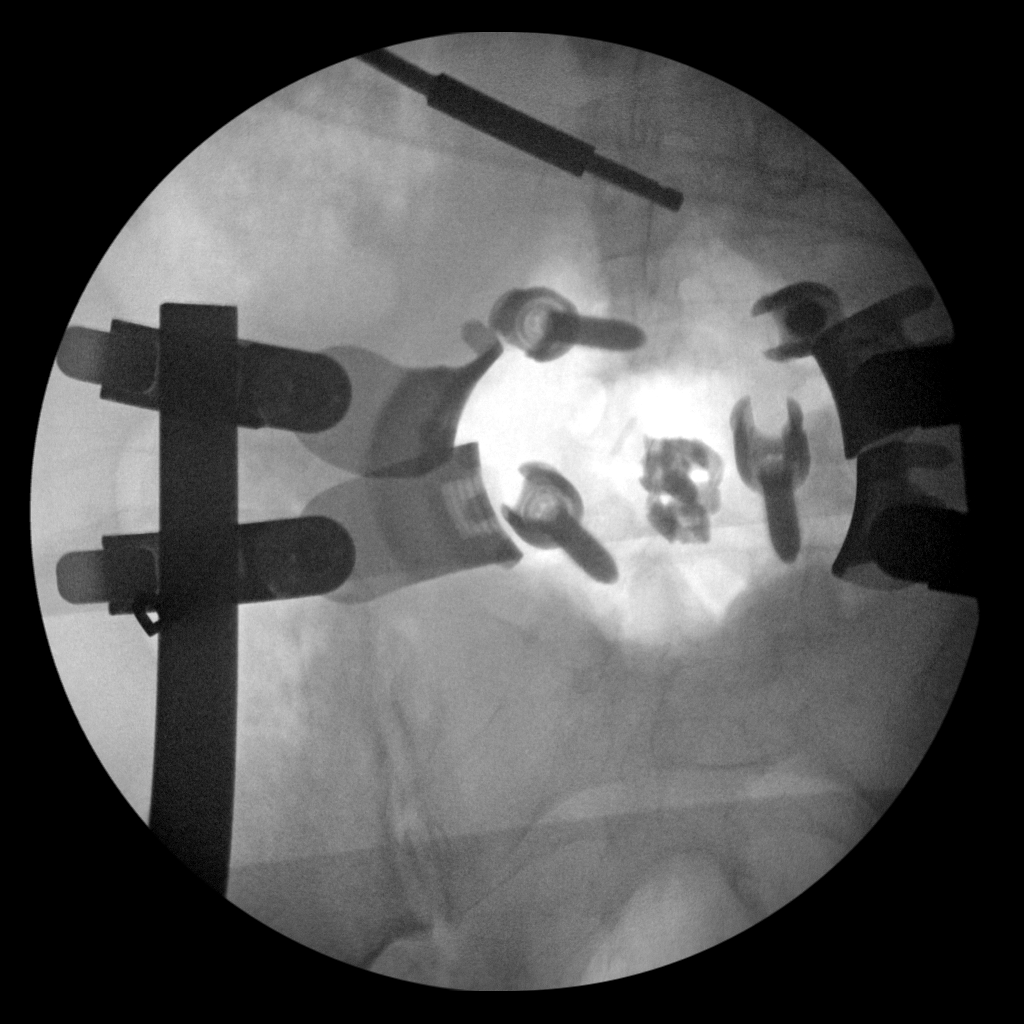

[2 of 2 positions shown; findings below may reference images not displayed]

FINDINGS: Two intraoperative fluoroscopic images of the lumbar spine were
submitted for review. These images demonstrate the patient to be
status post posterior fusion of L4-5 with bilateral intrapedicular
screw placement and interbody spacer. Good alignment of vertebral
bodies is noted.
IMPRESSION: Status post posterior fusion of L4-5.

## 2016-06-16 ENCOUNTER — Other Ambulatory Visit: Payer: Self-pay | Admitting: *Deleted

## 2016-06-16 ENCOUNTER — Telehealth: Payer: Self-pay | Admitting: *Deleted

## 2016-06-16 MED ORDER — LUBIPROSTONE 24 MCG PO CAPS: 24.0000 ug | ORAL_CAPSULE | Freq: Two times a day (BID) | ORAL | 3 refills | Status: AC

## 2016-06-16 MED ORDER — LUBIPROSTONE 24 MCG PO CAPS: 24.0000 ug | ORAL_CAPSULE | Freq: Two times a day (BID) | ORAL | 3 refills | Status: DC

## 2016-06-16 NOTE — Telephone Encounter (Signed)
Sent electronically to [facility] the script for twice daily amitiza 24 mcg. Called local pharmacy to cancel.
